# Patient Record
Sex: Female | Born: 1975 | Race: White | Hispanic: No | Smoking: Never smoker
Health system: Southern US, Community
[De-identification: ages and names within clinical notes are randomized; demographics above are authoritative.]

## PROBLEM LIST (undated history)

## (undated) DIAGNOSIS — R51 Headache: Secondary | ICD-10-CM

## (undated) DIAGNOSIS — R519 Headache, unspecified: Secondary | ICD-10-CM

## (undated) DIAGNOSIS — E041 Nontoxic single thyroid nodule: Secondary | ICD-10-CM

## (undated) DIAGNOSIS — I10 Essential (primary) hypertension: Secondary | ICD-10-CM

## (undated) HISTORY — DX: Headache, unspecified: R51.9

## (undated) HISTORY — DX: Essential (primary) hypertension: I10

## (undated) HISTORY — PX: FOOT SURGERY: SHX648

## (undated) HISTORY — DX: Headache: R51

---

## 2005-09-16 ENCOUNTER — Observation Stay: Payer: Self-pay | Admitting: Obstetrics and Gynecology

## 2005-11-11 ENCOUNTER — Observation Stay: Payer: Self-pay | Admitting: Obstetrics and Gynecology

## 2005-11-17 ENCOUNTER — Observation Stay: Payer: Self-pay | Admitting: Certified Nurse Midwife

## 2005-11-24 ENCOUNTER — Inpatient Hospital Stay: Payer: Self-pay

## 2008-05-12 HISTORY — PX: NOVASURE ABLATION: SHX5394

## 2009-05-12 HISTORY — PX: WRIST SURGERY: SHX841

## 2009-07-21 ENCOUNTER — Emergency Department: Payer: Self-pay | Admitting: Unknown Physician Specialty

## 2009-07-21 IMAGING — CR LEFT WRIST - COMPLETE 3+ VIEW
1 series · 3 of 3 positions shown · non-contrast
Comparison: none

REASON FOR EXAM: fall..pain at wrist
COMMENTS:   LMP: 2 weeks

[Series 1: view not recorded · 0.17mm/px · 3 of 3 slices shown]
[im 1/3]
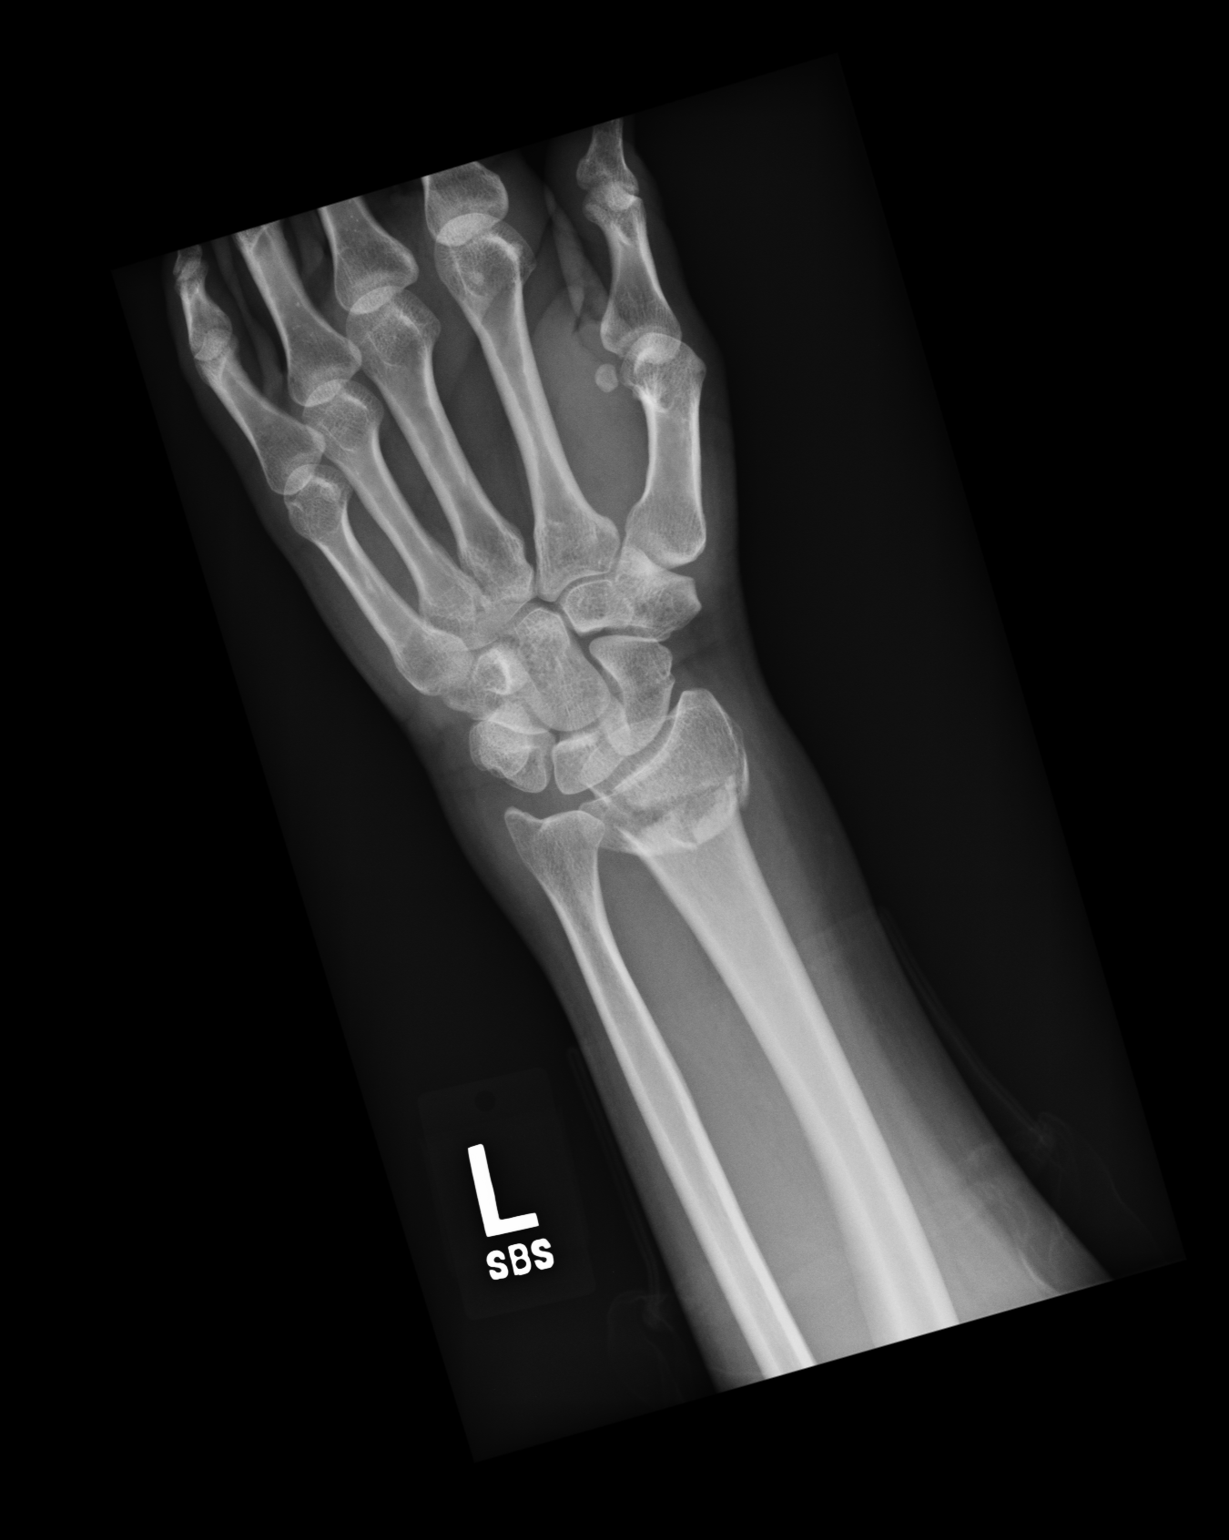
[im 2/3]
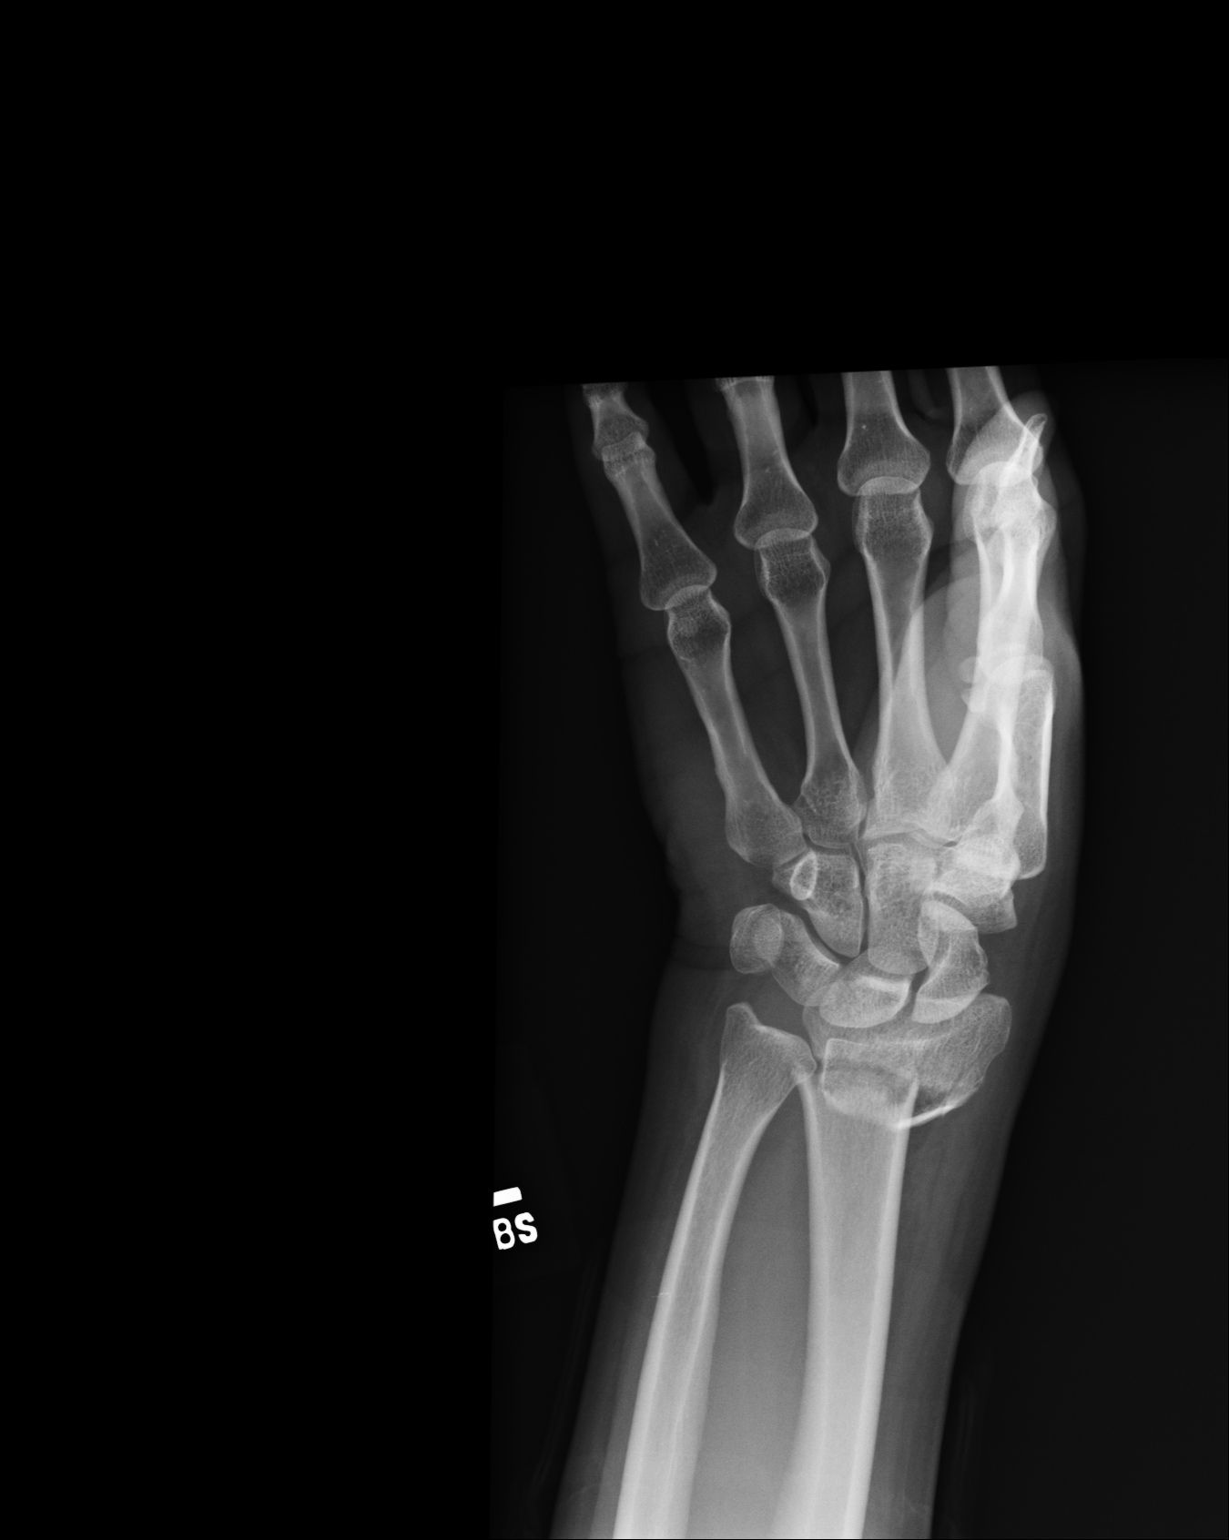
[im 3/3]
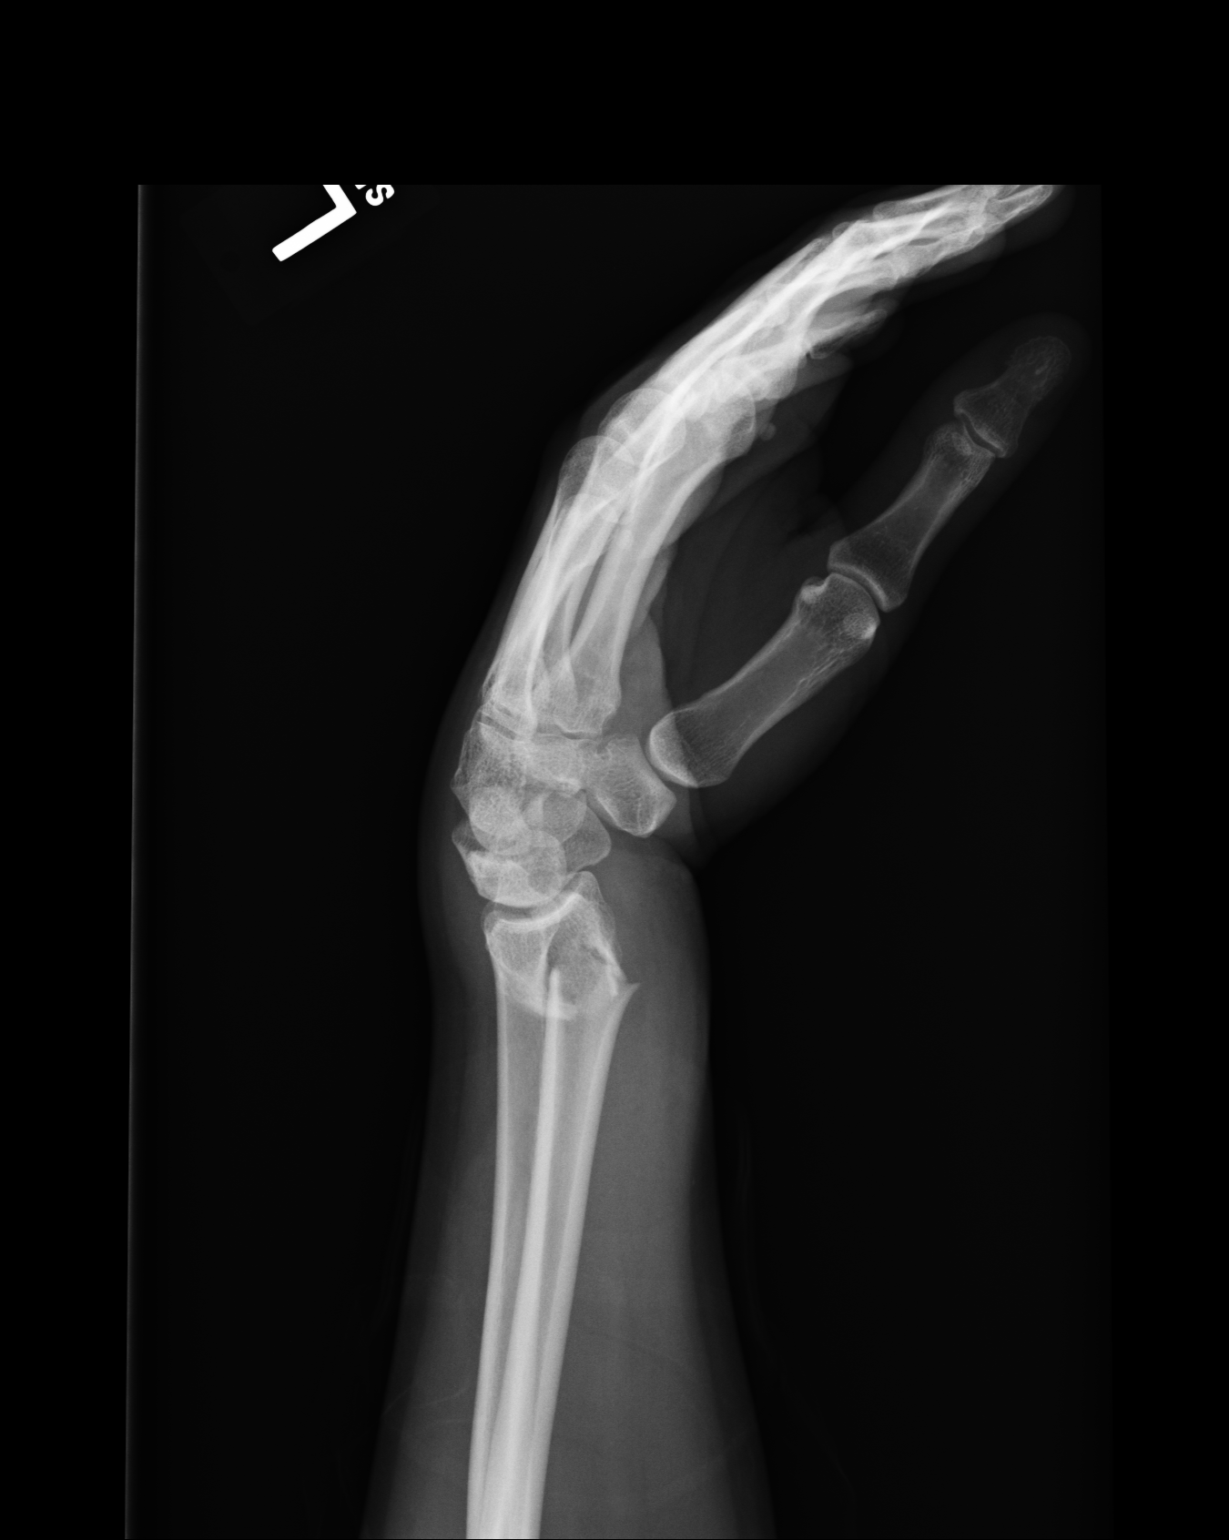

[3 of 3 positions shown; findings below may reference images not displayed]

PROCEDURE:     DXR - DXR WRIST LT COMP WITH OBLIQUES  - [DATE]  [DATE]

RESULT:

A comminuted impacted distal radius fracture is appreciated. The distal
fracture fragment demonstrates dorsal angulation and displacement. There is
intra-articular extension of the fracture into the radiocarpal joint as well
as within the distal radioulnar joint. The impacted portion of the fracture
demonstrates approximately 1.5 to 2 cm of override.
IMPRESSION: Distal radius fracture as described above.

## 2009-07-22 ENCOUNTER — Ambulatory Visit: Payer: Self-pay | Admitting: Unknown Physician Specialty

## 2010-12-16 ENCOUNTER — Ambulatory Visit: Payer: Self-pay | Admitting: Internal Medicine

## 2010-12-16 ENCOUNTER — Other Ambulatory Visit: Payer: Self-pay | Admitting: Internal Medicine

## 2011-10-09 ENCOUNTER — Encounter: Payer: Self-pay | Admitting: Internal Medicine

## 2011-10-09 ENCOUNTER — Ambulatory Visit (INDEPENDENT_AMBULATORY_CARE_PROVIDER_SITE_OTHER): Payer: BC Managed Care – PPO | Admitting: Internal Medicine

## 2011-10-09 VITALS — BP 128/86 | HR 66 | Temp 98.7°F | Resp 14 | Ht 67.0 in | Wt 164.8 lb

## 2011-10-09 DIAGNOSIS — I10 Essential (primary) hypertension: Secondary | ICD-10-CM

## 2011-10-09 DIAGNOSIS — Z309 Encounter for contraceptive management, unspecified: Secondary | ICD-10-CM

## 2011-10-09 DIAGNOSIS — K649 Unspecified hemorrhoids: Secondary | ICD-10-CM

## 2011-10-09 DIAGNOSIS — IMO0001 Reserved for inherently not codable concepts without codable children: Secondary | ICD-10-CM | POA: Insufficient documentation

## 2011-10-09 MED ORDER — HYDROCORTISONE 2.5 % RE CREA: TOPICAL_CREAM | Freq: Two times a day (BID) | RECTAL | Status: DC

## 2011-10-09 MED ORDER — NORETHINDRONE 0.35 MG PO TABS: 1.0000 | ORAL_TABLET | Freq: Every day | ORAL | Status: DC

## 2011-10-09 MED ORDER — ATENOLOL 50 MG PO TABS: 50.0000 mg | ORAL_TABLET | Freq: Every day | ORAL | Status: DC

## 2011-10-09 MED ORDER — TRIAMTERENE-HCTZ 37.5-25 MG PO TABS: 1.0000 | ORAL_TABLET | Freq: Every day | ORAL | Status: DC

## 2011-10-09 NOTE — Progress Notes (Signed)
Subjective:    Patient ID: Jennifer Olsen, female    DOB: [birth year], 36 y.o.   MRN: [id number]  HPI 36 year old female with history of hypertension presents to establish care. She reports that she has had elevated blood pressure since she was 18. This has been evaluated in the past with renal artery Doppler which was normal. She reports that nearly everyone in her family has elevated blood pressure. In the past, her blood pressure was well controlled with atenolol and diuretic. She is currently taking atenolol alone, and reports that her blood pressures typically elevated greater than 140/90 when she checks it. She has also had some palpitations. She denies any chest pain or shortness of breath. She is interested in resuming diuretic medication.  She also has a history of hemorrhoids. These have occurred intermittently since the birth of her children. They are controlled with topical hydrocortisone cream. They are not currently present or bleeding.  She notes some increased stress recently with separation from her husband and recent move into a new home. Otherwise, she reports that she is feeling well.  Outpatient Encounter Prescriptions as of 10/09/2011  Medication Sig Dispense Refill  . atenolol (TENORMIN) 50 MG tablet Take 1 tablet (50 mg total) by mouth daily.  30 tablet  11  . Ferrous Sulfate 27 MG TABS Take by mouth daily.      . hydrocortisone (PROCTOSOL HC) 2.5 % rectal cream Place rectally 2 (two) times daily.  30 g  11  . loratadine (CLARITIN) 10 MG tablet Take 10 mg by mouth daily.      . Multiple Vitamin (MULTIVITAMIN) tablet Take 1 tablet by mouth daily.      . norethindrone (MICRONOR,CAMILA,ERRIN) 0.35 MG tablet Take 1 tablet (0.35 mg total) by mouth daily.  1 Package  11  . triamterene-hydrochlorothiazide (MAXZIDE-25) 37.5-25 MG per tablet Take 1 each (1 tablet total) by mouth daily.  30 tablet  11   Review of Systems  Constitutional: Negative for fever, chills, appetite  change, fatigue and unexpected weight change.  HENT: Negative for ear pain, congestion, sore throat, trouble swallowing, neck pain, voice change and sinus pressure.   Eyes: Negative for visual disturbance.  Respiratory: Negative for cough, shortness of breath, wheezing and stridor.   Cardiovascular: Positive for palpitations. Negative for chest pain and leg swelling.  Gastrointestinal: Negative for nausea, vomiting, abdominal pain, diarrhea, constipation, blood in stool, abdominal distention and anal bleeding.  Genitourinary: Negative for dysuria and flank pain.  Musculoskeletal: Negative for myalgias, arthralgias and gait problem.  Skin: Negative for color change and rash.  Neurological: Negative for dizziness and headaches.  Hematological: Negative for adenopathy. Does not bruise/bleed easily.  Psychiatric/Behavioral: Negative for suicidal ideas, sleep disturbance and dysphoric mood. The patient is nervous/anxious.    BP 128/86  Pulse 66  Temp(Src) 98.7 F (37.1 C) (Oral)  Resp 14  Ht 5\' 7"  (1.702 m)  Wt 164 lb 13 oz (74.758 kg)  BMI 25.81 kg/m2  SpO2 98%  LMP 10/09/2011     Objective:   Physical Exam  Constitutional: She is oriented to person, place, and time. She appears well-developed and well-nourished. No distress.  HENT:  Head: Normocephalic and atraumatic.  Right Ear: External ear normal.  Left Ear: External ear normal.  Nose: Nose normal.  Mouth/Throat: Oropharynx is clear and moist. No oropharyngeal exudate.  Eyes: Conjunctivae are normal. Pupils are equal, round, and reactive to light. Right eye exhibits no discharge. Left eye exhibits no discharge. No scleral icterus.  Neck: Normal range of motion. Neck supple. No tracheal deviation present. No thyromegaly present.  Cardiovascular: Normal rate, regular rhythm, normal heart sounds and intact distal pulses.  Exam reveals no gallop and no friction rub.   No murmur heard. Pulmonary/Chest: Effort normal and breath sounds  normal. No respiratory distress. She has no wheezes. She has no rales. She exhibits no tenderness.  Abdominal: Soft. Bowel sounds are normal. She exhibits no distension and no mass. There is no tenderness. There is no rebound and no guarding.  Musculoskeletal: Normal range of motion. She exhibits no edema and no tenderness.  Lymphadenopathy:    She has no cervical adenopathy.  Neurological: She is alert and oriented to person, place, and time. No cranial nerve deficit. She exhibits normal muscle tone. Coordination normal.  Skin: Skin is warm and dry. No rash noted. She is not diaphoretic. No erythema. No pallor.  Psychiatric: She has a normal mood and affect. Her behavior is normal. Judgment and thought content normal.          Assessment & Plan:

## 2011-10-09 NOTE — Assessment & Plan Note (Signed)
Well-controlled today, however blood pressure elevated recently per patient. Will continue atenolol and will resume diuretic as this worked well for patient the past. We'll plan to check urine microalbumin and renal function with labs next week. Patient will followup in one month.

## 2011-10-09 NOTE — Assessment & Plan Note (Signed)
Tolerating oral contraceptives well. Will continue. Will get records on last Pap smear.

## 2011-10-09 NOTE — Assessment & Plan Note (Signed)
Currently asymptomatic. Will refill hydrocortisone cream for her to use as needed.

## 2012-10-01 ENCOUNTER — Other Ambulatory Visit: Payer: Self-pay | Admitting: Internal Medicine

## 2012-10-14 ENCOUNTER — Other Ambulatory Visit (INDEPENDENT_AMBULATORY_CARE_PROVIDER_SITE_OTHER): Payer: Managed Care, Other (non HMO)

## 2012-10-14 ENCOUNTER — Other Ambulatory Visit: Payer: BC Managed Care – PPO

## 2012-10-14 DIAGNOSIS — I10 Essential (primary) hypertension: Secondary | ICD-10-CM

## 2012-10-14 LAB — COMPREHENSIVE METABOLIC PANEL
AST: 20 U/L (ref 0–37)
BUN: 14 mg/dL (ref 6–23)
Calcium: 9.5 mg/dL (ref 8.4–10.5)
Chloride: 106 mEq/L (ref 96–112)
Creatinine, Ser: 0.7 mg/dL (ref 0.4–1.2)
GFR: 109.14 mL/min (ref >60.00)

## 2012-10-14 LAB — CBC WITH DIFFERENTIAL/PLATELET
Basophils Absolute: 0 10*3/uL (ref 0.0–0.1)
Eosinophils Absolute: 0.1 10*3/uL (ref 0.0–0.7)
HCT: 44.5 % (ref 36.0–46.0)
Lymphs Abs: 1.7 10*3/uL (ref 0.7–4.0)
MCHC: 33.3 g/dL (ref 30.0–36.0)
MCV: 92.4 fl (ref 78.0–100.0)
Monocytes Absolute: 0.6 10*3/uL (ref 0.1–1.0)
Platelets: 189 10*3/uL (ref 150.0–400.0)
RDW: 13.2 % (ref 11.5–14.6)

## 2012-10-14 LAB — LIPID PANEL
HDL: 42.8 mg/dL (ref >39.00)
Total CHOL/HDL Ratio: 3
Triglycerides: 63 mg/dL (ref 0.0–149.0)

## 2012-10-15 ENCOUNTER — Encounter: Payer: Self-pay | Admitting: *Deleted

## 2012-10-18 ENCOUNTER — Encounter: Payer: BC Managed Care – PPO | Admitting: Internal Medicine

## 2012-10-20 ENCOUNTER — Encounter: Payer: Self-pay | Admitting: Internal Medicine

## 2012-10-20 ENCOUNTER — Ambulatory Visit (INDEPENDENT_AMBULATORY_CARE_PROVIDER_SITE_OTHER): Payer: Managed Care, Other (non HMO) | Admitting: Internal Medicine

## 2012-10-20 ENCOUNTER — Other Ambulatory Visit (HOSPITAL_COMMUNITY)
Admission: RE | Admit: 2012-10-20 | Discharge: 2012-10-20 | Disposition: A | Discharge status: Home / Self Care | Payer: Managed Care, Other (non HMO) | Source: Ambulatory Visit | Attending: Internal Medicine | Admitting: Internal Medicine

## 2012-10-20 VITALS — BP 124/78 | HR 74 | Temp 98.5°F | Ht 67.25 in | Wt 160.0 lb

## 2012-10-20 DIAGNOSIS — K649 Unspecified hemorrhoids: Secondary | ICD-10-CM

## 2012-10-20 DIAGNOSIS — Z309 Encounter for contraceptive management, unspecified: Secondary | ICD-10-CM

## 2012-10-20 DIAGNOSIS — IMO0001 Reserved for inherently not codable concepts without codable children: Secondary | ICD-10-CM

## 2012-10-20 DIAGNOSIS — Z01419 Encounter for gynecological examination (general) (routine) without abnormal findings: Secondary | ICD-10-CM | POA: Insufficient documentation

## 2012-10-20 DIAGNOSIS — I10 Essential (primary) hypertension: Secondary | ICD-10-CM

## 2012-10-20 DIAGNOSIS — Z0000 Encounter for general adult medical examination without abnormal findings: Secondary | ICD-10-CM

## 2012-10-20 DIAGNOSIS — Z1151 Encounter for screening for human papillomavirus (HPV): Secondary | ICD-10-CM | POA: Insufficient documentation

## 2012-10-20 LAB — MICROALBUMIN / CREATININE URINE RATIO: Microalb Creat Ratio: 1 mg/g (ref 0.0–30.0)

## 2012-10-20 MED ORDER — TRIAMTERENE-HCTZ 37.5-25 MG PO TABS: ORAL_TABLET | ORAL | Status: DC

## 2012-10-20 MED ORDER — NORETHINDRONE 0.35 MG PO TABS: 1.0000 | ORAL_TABLET | Freq: Every day | ORAL | Status: DC

## 2012-10-20 MED ORDER — HYDROCORTISONE 2.5 % RE CREA: TOPICAL_CREAM | Freq: Two times a day (BID) | RECTAL | Status: DC

## 2012-10-20 MED ORDER — ATENOLOL 50 MG PO TABS: ORAL_TABLET | ORAL | Status: DC

## 2012-10-20 NOTE — Assessment & Plan Note (Signed)
BP Readings from Last 3 Encounters:  10/20/12 124/78  10/09/11 128/86   BP well controlled on current medication. Will continue.

## 2012-10-20 NOTE — Progress Notes (Signed)
Subjective:    Patient ID: Jennifer Olsen, female    DOB: Jun 06, 1975, 37 y.o.   MRN: [id number]  HPI 37YO female with h/o HTN presents for annual exam. Doing well. No concerns today. Compliant with medications. No headache, palpitations, chest pain.  Last PAP 2012 normal.  Outpatient Encounter Prescriptions as of 10/20/2012  Medication Sig Dispense Refill  . atenolol (TENORMIN) 50 MG tablet TAKE 1 TABLET (50 MG TOTAL) BY MOUTH DAILY.  90 tablet  4  . hydrocortisone (PROCTOSOL HC) 2.5 % rectal cream Place rectally 2 (two) times daily.  30 g  11  . Multiple Vitamin (MULTIVITAMIN) tablet Take 1 tablet by mouth daily.      . norethindrone (MICRONOR,CAMILA,ERRIN) 0.35 MG tablet Take 1 tablet (0.35 mg total) by mouth daily.  3 Package  4  . triamterene-hydrochlorothiazide (MAXZIDE-25) 37.5-25 MG per tablet TAKE 1 EACH (1 TABLET TOTAL) BY MOUTH DAILY.  90 tablet  4  . Ferrous Sulfate 27 MG TABS Take by mouth daily.      Marland Kitchen loratadine (CLARITIN) 10 MG tablet Take 10 mg by mouth daily.       No facility-administered encounter medications on file as of 10/20/2012.   BP 124/78  Pulse 74  Temp(Src) 98.5 F (36.9 C) (Oral)  Ht 5' 7.25" (1.708 m)  Wt 160 lb (72.576 kg)  BMI 24.88 kg/m2  SpO2 96%  LMP 08/20/2012  Review of Systems  Constitutional: Negative for fever, chills, appetite change, fatigue and unexpected weight change.  HENT: Negative for ear pain, congestion, sore throat, trouble swallowing, neck pain, voice change and sinus pressure.   Eyes: Negative for visual disturbance.  Respiratory: Negative for cough, shortness of breath, wheezing and stridor.   Cardiovascular: Negative for chest pain, palpitations and leg swelling.  Gastrointestinal: Negative for nausea, vomiting, abdominal pain, diarrhea, constipation, blood in stool, abdominal distention and anal bleeding.  Genitourinary: Negative for dysuria and flank pain.  Musculoskeletal: Negative for myalgias, arthralgias and gait  problem.  Skin: Negative for color change and rash.  Neurological: Negative for dizziness and headaches.  Hematological: Negative for adenopathy. Does not bruise/bleed easily.  Psychiatric/Behavioral: Negative for suicidal ideas, sleep disturbance and dysphoric mood. The patient is not nervous/anxious.        Objective:   Physical Exam  Constitutional: She is oriented to person, place, and time. She appears well-developed and well-nourished. No distress.  HENT:  Head: Normocephalic and atraumatic.  Right Ear: External ear normal.  Left Ear: External ear normal.  Nose: Nose normal.  Mouth/Throat: Oropharynx is clear and moist. No oropharyngeal exudate.  Eyes: Conjunctivae are normal. Pupils are equal, round, and reactive to light. Right eye exhibits no discharge. Left eye exhibits no discharge. No scleral icterus.  Neck: Normal range of motion. Neck supple. No tracheal deviation present. No thyromegaly present.  Cardiovascular: Normal rate, regular rhythm, normal heart sounds and intact distal pulses.  Exam reveals no gallop and no friction rub.   No murmur heard. Pulmonary/Chest: Effort normal and breath sounds normal. No accessory muscle usage. Not tachypneic. No respiratory distress. She has no decreased breath sounds. She has no wheezes. She has no rhonchi. She has no rales. She exhibits no tenderness.  Abdominal: Soft. Bowel sounds are normal. She exhibits no distension and no mass. There is no tenderness. There is no rebound and no guarding.  Genitourinary: Rectum normal, vagina normal and uterus normal. No breast swelling, tenderness, discharge or bleeding. Pelvic exam was performed with patient supine. There is no rash,  tenderness or lesion on the right labia. There is no rash, tenderness or lesion on the left labia. Uterus is not enlarged and not tender. Cervix exhibits no motion tenderness, no discharge and no friability. Right adnexum displays no mass, no tenderness and no fullness.  Left adnexum displays no mass, no tenderness and no fullness. No erythema or tenderness around the vagina. No vaginal discharge found.  Musculoskeletal: Normal range of motion. She exhibits no edema and no tenderness.  Lymphadenopathy:    She has no cervical adenopathy.  Neurological: She is alert and oriented to person, place, and time. No cranial nerve deficit. She exhibits normal muscle tone. Coordination normal.  Skin: Skin is warm and dry. No rash noted. She is not diaphoretic. No erythema. No pallor.  Psychiatric: She has a normal mood and affect. Her behavior is normal. Judgment and thought content normal.          Assessment & Plan:

## 2012-10-20 NOTE — Assessment & Plan Note (Signed)
General medical exam normal today including breast and pelvic exam. PAP is pending. Reviewed recent labs which were normal. Immunizations are UTD. Encouraged continued healthy lifestyle. Follow up 6 months and prn.

## 2012-10-21 ENCOUNTER — Encounter: Payer: Self-pay | Admitting: *Deleted

## 2012-10-21 NOTE — Progress Notes (Signed)
Quick Note:  Normal letter mailed to patient home address on file. ______ 

## 2012-10-25 ENCOUNTER — Encounter: Payer: Self-pay | Admitting: *Deleted

## 2012-10-25 ENCOUNTER — Other Ambulatory Visit: Payer: Self-pay | Admitting: Internal Medicine

## 2012-10-25 NOTE — Telephone Encounter (Signed)
Please Advise

## 2012-10-28 ENCOUNTER — Other Ambulatory Visit: Payer: Self-pay | Admitting: Internal Medicine

## 2012-10-29 ENCOUNTER — Telehealth: Payer: Self-pay | Admitting: Internal Medicine

## 2012-10-29 NOTE — Telephone Encounter (Signed)
Patient needing all her medication's refilled

## 2012-10-29 NOTE — Telephone Encounter (Signed)
Spoke with patient she stated she called the pharmacy and was told her prescriptions was not there. Explained to the patient we had sent the prescription to the pharmacy and had confirmation of receipt. Patient was not very happy with this, I then called [facility] to check on this. Spoke with pharmacist Leonette Most and he stated the prescriptions were there and ready to picked up. The patient aware she has multiple profiles there and they can not merge them so when she calls she needs to give them her date of birth and be more specific with the name she is giving them. Informed the patient of this and she was still not very happy with this, while I was still on the phone I heard her talking to the pharmacist then she disconnected the call.

## 2013-05-02 ENCOUNTER — Ambulatory Visit: Payer: Managed Care, Other (non HMO) | Admitting: Internal Medicine

## 2013-10-20 ENCOUNTER — Encounter: Payer: Self-pay | Admitting: Internal Medicine

## 2013-10-20 LAB — HM PAP SMEAR: HM Pap smear: NEGATIVE

## 2013-11-02 ENCOUNTER — Encounter: Payer: Self-pay | Admitting: Internal Medicine

## 2013-11-15 ENCOUNTER — Other Ambulatory Visit: Payer: Self-pay | Admitting: Internal Medicine

## 2013-12-19 ENCOUNTER — Encounter: Payer: Self-pay | Admitting: Internal Medicine

## 2013-12-27 ENCOUNTER — Ambulatory Visit (INDEPENDENT_AMBULATORY_CARE_PROVIDER_SITE_OTHER): Payer: Managed Care, Other (non HMO) | Admitting: Internal Medicine

## 2013-12-27 ENCOUNTER — Encounter: Payer: Self-pay | Admitting: Internal Medicine

## 2013-12-27 VITALS — BP 118/82 | HR 67 | Temp 98.3°F | Ht 66.75 in | Wt 158.5 lb

## 2013-12-27 DIAGNOSIS — K649 Unspecified hemorrhoids: Secondary | ICD-10-CM

## 2013-12-27 DIAGNOSIS — Z0000 Encounter for general adult medical examination without abnormal findings: Secondary | ICD-10-CM | POA: Diagnosis not present

## 2013-12-27 LAB — CBC WITH DIFFERENTIAL/PLATELET
BASOS PCT: 0.6 % (ref 0.0–3.0)
Basophils Absolute: 0 10*3/uL (ref 0.0–0.1)
Eosinophils Absolute: 0.1 10*3/uL (ref 0.0–0.7)
Eosinophils Relative: 1.8 % (ref 0.0–5.0)
HCT: 43.5 % (ref 36.0–46.0)
Hemoglobin: 14.4 g/dL (ref 12.0–15.0)
LYMPHS PCT: 35.2 % (ref 12.0–46.0)
Lymphs Abs: 2 10*3/uL (ref 0.7–4.0)
MCHC: 33.2 g/dL (ref 30.0–36.0)
MCV: 91.3 fl (ref 78.0–100.0)
MONOS PCT: 12 % (ref 3.0–12.0)
Monocytes Absolute: 0.7 10*3/uL (ref 0.1–1.0)
Neutro Abs: 2.8 10*3/uL (ref 1.4–7.7)
Neutrophils Relative %: 50.4 % (ref 43.0–77.0)
Platelets: 191 10*3/uL (ref 150.0–400.0)
RBC: 4.77 Mil/uL (ref 3.87–5.11)
RDW: 13.4 % (ref 11.5–15.5)
WBC: 5.6 10*3/uL (ref 4.0–10.5)

## 2013-12-27 LAB — LIPID PANEL
CHOL/HDL RATIO: 3
Cholesterol: 152 mg/dL (ref 0–200)
HDL: 47.1 mg/dL (ref >39.00)
LDL Cholesterol: 86 mg/dL (ref 0–99)
NONHDL: 104.9
TRIGLYCERIDES: 94 mg/dL (ref 0.0–149.0)
VLDL: 18.8 mg/dL (ref 0.0–40.0)

## 2013-12-27 LAB — MICROALBUMIN / CREATININE URINE RATIO
CREATININE, U: 94.4 mg/dL
MICROALB UR: 2.4 mg/dL — AB (ref 0.0–1.9)
MICROALB/CREAT RATIO: 2.5 mg/g (ref 0.0–30.0)

## 2013-12-27 LAB — POCT URINALYSIS DIPSTICK
Bilirubin, UA: NEGATIVE
Glucose, UA: NEGATIVE
Ketones, UA: NEGATIVE
Leukocytes, UA: NEGATIVE
Nitrite, UA: NEGATIVE
PROTEIN UA: NEGATIVE
RBC UA: NEGATIVE
SPEC GRAV UA: 1.015
UROBILINOGEN UA: 0.2
pH, UA: 6.5

## 2013-12-27 LAB — COMPREHENSIVE METABOLIC PANEL
ALBUMIN: 4.5 g/dL (ref 3.5–5.2)
ALT: 12 U/L (ref 0–35)
AST: 18 U/L (ref 0–37)
Alkaline Phosphatase: 40 U/L (ref 39–117)
BUN: 16 mg/dL (ref 6–23)
CALCIUM: 10.2 mg/dL (ref 8.4–10.5)
CHLORIDE: 102 meq/L (ref 96–112)
CO2: 29 mEq/L (ref 19–32)
Creatinine, Ser: 0.7 mg/dL (ref 0.4–1.2)
GFR: 108.43 mL/min (ref >60.00)
Glucose, Bld: 83 mg/dL (ref 70–99)
POTASSIUM: 4.2 meq/L (ref 3.5–5.1)
Sodium: 139 mEq/L (ref 135–145)
Total Bilirubin: 0.9 mg/dL (ref 0.2–1.2)
Total Protein: 7.2 g/dL (ref 6.0–8.3)

## 2013-12-27 LAB — VITAMIN D 25 HYDROXY (VIT D DEFICIENCY, FRACTURES): VITD: 37.86 ng/mL (ref 30.00–100.00)

## 2013-12-27 MED ORDER — HYDROCORTISONE 2.5 % RE CREA: TOPICAL_CREAM | Freq: Two times a day (BID) | RECTAL | Status: DC

## 2013-12-27 MED ORDER — NORETHINDRONE 0.35 MG PO TABS: 1.0000 | ORAL_TABLET | Freq: Every day | ORAL | Status: DC

## 2013-12-27 MED ORDER — TRIAMTERENE-HCTZ 37.5-25 MG PO TABS: 1.0000 | ORAL_TABLET | Freq: Every day | ORAL | Status: DC

## 2013-12-27 MED ORDER — ATENOLOL 50 MG PO TABS: 50.0000 mg | ORAL_TABLET | Freq: Every day | ORAL | Status: DC

## 2013-12-27 NOTE — Assessment & Plan Note (Signed)
General medical exam including breast exam normal today. PAP and pelvic deferred as PAP normal in 2014. Plan repeat PAP in 2017. Labs today including CBC, CMP, lipids. Plan for flu vaccine this fall. Encouraged healthy diet and exercise.

## 2013-12-27 NOTE — Progress Notes (Signed)
Pre visit review using our clinic review tool, if applicable. No additional management support is needed unless otherwise documented below in the visit note. 

## 2013-12-27 NOTE — Patient Instructions (Signed)

## 2013-12-27 NOTE — Progress Notes (Signed)
Subjective:    Patient ID: Jennifer Olsen, female    DOB: 24-Feb-1976, 38 y.o.   MRN: [id number]  HPI 38YO female presents for annual exam.  Feeling well. Recently married. No concerns today. Tries to follow healthy diet and stay physically active.   Review of Systems  Constitutional: Negative for fever, chills, appetite change, fatigue and unexpected weight change.  Eyes: Negative for visual disturbance.  Respiratory: Negative for shortness of breath.   Cardiovascular: Negative for chest pain and leg swelling.  Gastrointestinal: Negative for nausea, vomiting, abdominal pain, diarrhea and constipation.  Genitourinary: Negative for menstrual problem.  Musculoskeletal: Negative for arthralgias and myalgias.  Skin: Negative for color change and rash.  Neurological: Negative for weakness and headaches.  Hematological: Negative for adenopathy. Does not bruise/bleed easily.  Psychiatric/Behavioral: Negative for dysphoric mood. The patient is not nervous/anxious.        Objective:    BP 118/82  Pulse 67  Temp(Src) 98.3 F (36.8 C) (Oral)  Ht 5' 6.75" (1.695 m)  Wt 158 lb 8 oz (71.895 kg)  BMI 25.02 kg/m2  SpO2 99%  LMP 12/16/2013 Physical Exam  Constitutional: She is oriented to person, place, and time. She appears well-developed and well-nourished. No distress.  HENT:  Head: Normocephalic and atraumatic.  Right Ear: External ear normal.  Left Ear: External ear normal.  Nose: Nose normal.  Mouth/Throat: Oropharynx is clear and moist. No oropharyngeal exudate.  Eyes: Conjunctivae are normal. Pupils are equal, round, and reactive to light. Right eye exhibits no discharge. Left eye exhibits no discharge. No scleral icterus.  Neck: Normal range of motion. Neck supple. No tracheal deviation present. No thyromegaly present.  Cardiovascular: Normal rate, regular rhythm, normal heart sounds and intact distal pulses.  Exam reveals no gallop and no friction rub.   No murmur  heard. Pulmonary/Chest: Effort normal and breath sounds normal. No accessory muscle usage. Not tachypneic. No respiratory distress. She has no decreased breath sounds. She has no wheezes. She has no rales. She exhibits no tenderness. Right breast exhibits no inverted nipple, no mass, no nipple discharge, no skin change and no tenderness. Left breast exhibits no inverted nipple, no mass, no nipple discharge, no skin change and no tenderness. Breasts are symmetrical.  Abdominal: Soft. Bowel sounds are normal. She exhibits no distension and no mass. There is no tenderness. There is no rebound and no guarding.  Musculoskeletal: Normal range of motion. She exhibits no edema and no tenderness.  Lymphadenopathy:    She has no cervical adenopathy.  Neurological: She is alert and oriented to person, place, and time. No cranial nerve deficit. She exhibits normal muscle tone. Coordination normal.  Skin: Skin is warm and dry. No rash noted. She is not diaphoretic. No erythema. No pallor.  Psychiatric: She has a normal mood and affect. Her behavior is normal. Judgment and thought content normal.          Assessment & Plan:   Problem List Items Addressed This Visit     Unprioritized   Hemorrhoids   Relevant Medications      triamterene-hydrochlorothiazide (MAXZIDE-25) 37.5-25 MG per tablet      atenolol (TENORMIN) tablet      hydrocortisone (PROCTOSOL HC) 2.5 % rectal cream   Other Relevant Orders      POCT urinalysis dipstick (Completed)   Routine general medical examination at a health care facility - Primary     General medical exam including breast exam normal today. PAP and pelvic deferred as PAP  normal in 2014. Plan repeat PAP in 2017. Labs today including CBC, CMP, lipids. Plan for flu vaccine this fall. Encouraged healthy diet and exercise.    Relevant Orders      CBC with Differential      Comprehensive metabolic panel      Lipid panel      Vit D  25 hydroxy (rtn osteoporosis monitoring)       Microalbumin / creatinine urine ratio      POCT urinalysis dipstick (Completed)       Return in about 1 year (around 12/28/2014) for Physical.

## 2013-12-28 ENCOUNTER — Encounter: Payer: Self-pay | Admitting: *Deleted

## 2014-12-18 ENCOUNTER — Other Ambulatory Visit: Payer: Self-pay | Admitting: Internal Medicine

## 2014-12-18 NOTE — Telephone Encounter (Signed)
Last OV 12/14/13 ok to fill?

## 2014-12-29 ENCOUNTER — Ambulatory Visit (INDEPENDENT_AMBULATORY_CARE_PROVIDER_SITE_OTHER): Payer: 59 | Admitting: Internal Medicine

## 2014-12-29 ENCOUNTER — Encounter: Payer: Self-pay | Admitting: Internal Medicine

## 2014-12-29 VITALS — BP 116/73 | HR 73 | Temp 98.3°F | Ht 67.25 in | Wt 153.0 lb

## 2014-12-29 DIAGNOSIS — Z0000 Encounter for general adult medical examination without abnormal findings: Secondary | ICD-10-CM

## 2014-12-29 DIAGNOSIS — K649 Unspecified hemorrhoids: Secondary | ICD-10-CM

## 2014-12-29 LAB — CBC WITH DIFFERENTIAL/PLATELET
BASOS PCT: 0.7 % (ref 0.0–3.0)
Basophils Absolute: 0 10*3/uL (ref 0.0–0.1)
EOS ABS: 0.2 10*3/uL (ref 0.0–0.7)
Eosinophils Relative: 3.7 % (ref 0.0–5.0)
HEMATOCRIT: 42 % (ref 36.0–46.0)
Hemoglobin: 14.2 g/dL (ref 12.0–15.0)
LYMPHS PCT: 26.8 % (ref 12.0–46.0)
Lymphs Abs: 1.5 10*3/uL (ref 0.7–4.0)
MCHC: 33.8 g/dL (ref 30.0–36.0)
MCV: 91.2 fl (ref 78.0–100.0)
MONO ABS: 0.6 10*3/uL (ref 0.1–1.0)
Monocytes Relative: 10.8 % (ref 3.0–12.0)
NEUTROS ABS: 3.3 10*3/uL (ref 1.4–7.7)
Neutrophils Relative %: 58 % (ref 43.0–77.0)
PLATELETS: 184 10*3/uL (ref 150.0–400.0)
RBC: 4.61 Mil/uL (ref 3.87–5.11)
RDW: 13.1 % (ref 11.5–15.5)
WBC: 5.6 10*3/uL (ref 4.0–10.5)

## 2014-12-29 LAB — LIPID PANEL
CHOL/HDL RATIO: 3
Cholesterol: 143 mg/dL (ref 0–200)
HDL: 48.1 mg/dL (ref >39.00)
LDL Cholesterol: 85 mg/dL (ref 0–99)
NONHDL: 94.74
Triglycerides: 47 mg/dL (ref 0.0–149.0)
VLDL: 9.4 mg/dL (ref 0.0–40.0)

## 2014-12-29 LAB — COMPREHENSIVE METABOLIC PANEL
ALT: 14 U/L (ref 0–35)
AST: 17 U/L (ref 0–37)
Albumin: 4.3 g/dL (ref 3.5–5.2)
Alkaline Phosphatase: 40 U/L (ref 39–117)
BILIRUBIN TOTAL: 1.1 mg/dL (ref 0.2–1.2)
BUN: 16 mg/dL (ref 6–23)
CO2: 28 meq/L (ref 19–32)
Calcium: 9.1 mg/dL (ref 8.4–10.5)
Chloride: 103 mEq/L (ref 96–112)
Creatinine, Ser: 0.67 mg/dL (ref 0.40–1.20)
GFR: 104.15 mL/min (ref >60.00)
GLUCOSE: 81 mg/dL (ref 70–99)
Potassium: 3.6 mEq/L (ref 3.5–5.1)
Sodium: 138 mEq/L (ref 135–145)
Total Protein: 6.3 g/dL (ref 6.0–8.3)

## 2014-12-29 LAB — MICROALBUMIN / CREATININE URINE RATIO
CREATININE, U: 265.2 mg/dL
MICROALB UR: 1.3 mg/dL (ref 0.0–1.9)
Microalb Creat Ratio: 0.5 mg/g (ref 0.0–30.0)

## 2014-12-29 LAB — VITAMIN D 25 HYDROXY (VIT D DEFICIENCY, FRACTURES): VITD: 43.61 ng/mL (ref 30.00–100.00)

## 2014-12-29 LAB — TSH: TSH: 0.96 u[IU]/mL (ref 0.35–4.50)

## 2014-12-29 MED ORDER — HYDROCORTISONE 2.5 % RE CREA: TOPICAL_CREAM | Freq: Two times a day (BID) | RECTAL | Status: DC

## 2014-12-29 MED ORDER — ATENOLOL 50 MG PO TABS: 50.0000 mg | ORAL_TABLET | Freq: Every day | ORAL | Status: DC

## 2014-12-29 MED ORDER — TRIAMTERENE-HCTZ 37.5-25 MG PO TABS: 1.0000 | ORAL_TABLET | Freq: Every day | ORAL | Status: DC

## 2014-12-29 MED ORDER — NORETHINDRONE 0.35 MG PO TABS: 1.0000 | ORAL_TABLET | Freq: Every day | ORAL | Status: DC

## 2014-12-29 NOTE — Patient Instructions (Signed)

## 2014-12-29 NOTE — Assessment & Plan Note (Signed)
General medical exam normal today. Pelvic exam deferred as PAP normal 2014, HPV neg. Discussed breast cancer screening. Encouraged healthy diet and exercise. Labs as ordered. Flu vaccine this fall.

## 2014-12-29 NOTE — Progress Notes (Signed)
Pre visit review using our clinic review tool, if applicable. No additional management support is needed unless otherwise documented below in the visit note. 

## 2014-12-29 NOTE — Progress Notes (Signed)
Subjective:    Patient ID: Jennifer Olsen, female    DOB: 1975/06/17, 39 y.o.   MRN: [id number]  HPI  39YO female presents for physical exam.  Feeling well. No concerns today.   Past Medical History  Diagnosis Date  . Persistent headaches   . Hypertension     s/p renal artery doppler which was normal   Family History  Problem Relation Age of Onset  . Aneurysm Mother   . Heart disease Mother   . Cancer Mother     breast or uterine   Past Surgical History  Procedure Laterality Date  . Foot surgery      bilateral, bunions, Dr. Elvina Mattes  . Wrist surgery  2011    left wrist, fracture, Dr. Mauri Pole  . Novasure ablation  2010    Dr. Amalia Hailey   Social History   Social History  . Marital Status: Single    Spouse Name: N/A  . Number of Children: N/A  . Years of Education: N/A   Social History Main Topics  . Smoking status: Never Smoker   . Smokeless tobacco: None  . Alcohol Use: No  . Drug Use: No  . Sexual Activity: Not Asked   Other Topics Concern  . None   Social History Narrative   Lives in [place] with 3 children 16, 12, 5YO.  Dog in home.   Recently married.   Works - [employer]   Diet - regular   Exercise - walking 20-51min daily    Review of Systems  Constitutional: Negative for fever, chills, appetite change, fatigue and unexpected weight change.  Eyes: Negative for visual disturbance.  Respiratory: Negative for shortness of breath.   Cardiovascular: Negative for chest pain and leg swelling.  Gastrointestinal: Negative for nausea, vomiting, abdominal pain, diarrhea and constipation.  Musculoskeletal: Negative for myalgias and arthralgias.  Skin: Negative for color change and rash.  Hematological: Negative for adenopathy. Does not bruise/bleed easily.  Psychiatric/Behavioral: Negative for suicidal ideas, sleep disturbance and dysphoric mood. The patient is not nervous/anxious.        Objective:    BP 116/73 mmHg  Pulse 73  Temp(Src) 98.3  F (36.8 C) (Oral)  Ht 5' 7.25" (1.708 m)  Wt 153 lb (69.4 kg)  BMI 23.79 kg/m2  SpO2 98% Physical Exam  Constitutional: She is oriented to person, place, and time. She appears well-developed and well-nourished. No distress.  HENT:  Head: Normocephalic and atraumatic.  Right Ear: External ear normal.  Left Ear: External ear normal.  Nose: Nose normal.  Mouth/Throat: Oropharynx is clear and moist. No oropharyngeal exudate.  Eyes: Conjunctivae and EOM are normal. Pupils are equal, round, and reactive to light. Right eye exhibits no discharge.  Neck: Normal range of motion. Neck supple. No thyromegaly present.  Cardiovascular: Normal rate, regular rhythm, normal heart sounds and intact distal pulses.  Exam reveals no gallop and no friction rub.   No murmur heard. Pulmonary/Chest: Effort normal. No respiratory distress. She has no wheezes. She has no rales.  Abdominal: Soft. Bowel sounds are normal. She exhibits no distension and no mass. There is no tenderness. There is no rebound and no guarding.  Musculoskeletal: Normal range of motion. She exhibits no edema or tenderness.  Lymphadenopathy:    She has no cervical adenopathy.  Neurological: She is alert and oriented to person, place, and time. No cranial nerve deficit. Coordination normal.  Skin: Skin is warm and dry. No rash noted. She is not diaphoretic. No erythema.  No pallor.  Psychiatric: She has a normal mood and affect. Her behavior is normal. Judgment and thought content normal.          Assessment & Plan:   Problem List Items Addressed This Visit      Unprioritized   Hemorrhoids   Relevant Medications   atenolol (TENORMIN) 50 MG tablet   hydrocortisone (PROCTOSOL HC) 2.5 % rectal cream   triamterene-hydrochlorothiazide (MAXZIDE-25) 37.5-25 MG per tablet   Routine general medical examination at a health care facility - Primary    General medical exam normal today. Pelvic exam deferred as PAP normal 2014, HPV neg.  Discussed breast cancer screening. Encouraged healthy diet and exercise. Labs as ordered. Flu vaccine this fall.      Relevant Orders   TSH   CBC with Differential/Platelet   Comprehensive metabolic panel   Lipid panel   Vit D  25 hydroxy (rtn osteoporosis monitoring)   Microalbumin / creatinine urine ratio       Return in about 1 year (around 12/29/2015) for Physical.

## 2015-05-13 HISTORY — PX: AUGMENTATION MAMMAPLASTY: SUR837

## 2015-09-25 DIAGNOSIS — M9904 Segmental and somatic dysfunction of sacral region: Secondary | ICD-10-CM | POA: Diagnosis not present

## 2015-09-25 DIAGNOSIS — M9901 Segmental and somatic dysfunction of cervical region: Secondary | ICD-10-CM | POA: Diagnosis not present

## 2015-09-25 DIAGNOSIS — M5413 Radiculopathy, cervicothoracic region: Secondary | ICD-10-CM | POA: Diagnosis not present

## 2015-09-25 DIAGNOSIS — M9903 Segmental and somatic dysfunction of lumbar region: Secondary | ICD-10-CM | POA: Diagnosis not present

## 2015-09-25 DIAGNOSIS — M461 Sacroiliitis, not elsewhere classified: Secondary | ICD-10-CM | POA: Diagnosis not present

## 2015-09-25 DIAGNOSIS — M545 Low back pain: Secondary | ICD-10-CM | POA: Diagnosis not present

## 2015-11-06 DIAGNOSIS — M479 Spondylosis, unspecified: Secondary | ICD-10-CM | POA: Diagnosis not present

## 2015-11-06 DIAGNOSIS — M47817 Spondylosis without myelopathy or radiculopathy, lumbosacral region: Secondary | ICD-10-CM | POA: Diagnosis not present

## 2015-12-31 ENCOUNTER — Other Ambulatory Visit: Payer: Self-pay

## 2015-12-31 ENCOUNTER — Encounter: Payer: 59 | Admitting: Family Medicine

## 2015-12-31 MED ORDER — ATENOLOL 50 MG PO TABS: 50.0000 mg | ORAL_TABLET | Freq: Every day | ORAL | 0 refills | Status: DC

## 2015-12-31 MED ORDER — TRIAMTERENE-HCTZ 37.5-25 MG PO TABS: 1.0000 | ORAL_TABLET | Freq: Every day | ORAL | 0 refills | Status: DC

## 2015-12-31 NOTE — Telephone Encounter (Signed)
Refill request for Tenormin & Maxide-25, last seen 19AUG2016, last filled 19AUG2016. By Dr. Gilford Rile.  Please advise.

## 2016-01-01 NOTE — Telephone Encounter (Signed)
It appears that this has already been sent in.  She needs to keep her establish care appt with me.

## 2016-01-18 ENCOUNTER — Ambulatory Visit (INDEPENDENT_AMBULATORY_CARE_PROVIDER_SITE_OTHER): Payer: 59 | Admitting: Internal Medicine

## 2016-01-18 ENCOUNTER — Encounter: Payer: Self-pay | Admitting: Internal Medicine

## 2016-01-18 ENCOUNTER — Other Ambulatory Visit (HOSPITAL_COMMUNITY)
Admission: RE | Admit: 2016-01-18 | Discharge: 2016-01-18 | Disposition: A | Discharge status: Home / Self Care | Payer: 59 | Source: Ambulatory Visit | Attending: Internal Medicine | Admitting: Internal Medicine

## 2016-01-18 VITALS — BP 116/70 | HR 68 | Temp 98.5°F | Ht 67.0 in | Wt 157.2 lb

## 2016-01-18 DIAGNOSIS — Z0000 Encounter for general adult medical examination without abnormal findings: Secondary | ICD-10-CM

## 2016-01-18 DIAGNOSIS — Z1151 Encounter for screening for human papillomavirus (HPV): Secondary | ICD-10-CM | POA: Insufficient documentation

## 2016-01-18 DIAGNOSIS — Z01419 Encounter for gynecological examination (general) (routine) without abnormal findings: Secondary | ICD-10-CM | POA: Diagnosis not present

## 2016-01-18 DIAGNOSIS — Z3041 Encounter for surveillance of contraceptive pills: Secondary | ICD-10-CM | POA: Diagnosis not present

## 2016-01-18 DIAGNOSIS — Z124 Encounter for screening for malignant neoplasm of cervix: Secondary | ICD-10-CM | POA: Diagnosis not present

## 2016-01-18 DIAGNOSIS — I10 Essential (primary) hypertension: Secondary | ICD-10-CM | POA: Diagnosis not present

## 2016-01-18 DIAGNOSIS — K649 Unspecified hemorrhoids: Secondary | ICD-10-CM

## 2016-01-18 LAB — CBC WITH DIFFERENTIAL/PLATELET
BASOS ABS: 0 10*3/uL (ref 0.0–0.1)
Basophils Relative: 0.5 % (ref 0.0–3.0)
EOS ABS: 0.1 10*3/uL (ref 0.0–0.7)
Eosinophils Relative: 1 % (ref 0.0–5.0)
HCT: 42.4 % (ref 36.0–46.0)
Hemoglobin: 14.8 g/dL (ref 12.0–15.0)
LYMPHS ABS: 1.5 10*3/uL (ref 0.7–4.0)
Lymphocytes Relative: 29.7 % (ref 12.0–46.0)
MCHC: 34.9 g/dL (ref 30.0–36.0)
MCV: 89.2 fl (ref 78.0–100.0)
MONO ABS: 0.5 10*3/uL (ref 0.1–1.0)
MONOS PCT: 10.6 % (ref 3.0–12.0)
NEUTROS PCT: 58.2 % (ref 43.0–77.0)
Neutro Abs: 3 10*3/uL (ref 1.4–7.7)
Platelets: 191 10*3/uL (ref 150.0–400.0)
RBC: 4.76 Mil/uL (ref 3.87–5.11)
RDW: 13 % (ref 11.5–15.5)
WBC: 5.2 10*3/uL (ref 4.0–10.5)

## 2016-01-18 LAB — COMPREHENSIVE METABOLIC PANEL
ALBUMIN: 4.4 g/dL (ref 3.5–5.2)
ALK PHOS: 45 U/L (ref 39–117)
ALT: 11 U/L (ref 0–35)
AST: 14 U/L (ref 0–37)
BILIRUBIN TOTAL: 0.7 mg/dL (ref 0.2–1.2)
BUN: 17 mg/dL (ref 6–23)
CALCIUM: 8.9 mg/dL (ref 8.4–10.5)
CO2: 30 meq/L (ref 19–32)
CREATININE: 0.78 mg/dL (ref 0.40–1.20)
Chloride: 105 mEq/L (ref 96–112)
GFR: 86.92 mL/min (ref >60.00)
Glucose, Bld: 94 mg/dL (ref 70–99)
Potassium: 3.6 mEq/L (ref 3.5–5.1)
Sodium: 139 mEq/L (ref 135–145)
TOTAL PROTEIN: 6.8 g/dL (ref 6.0–8.3)

## 2016-01-18 LAB — MICROALBUMIN / CREATININE URINE RATIO
CREATININE, U: 388 mg/dL
MICROALB UR: 1.7 mg/dL (ref 0.0–1.9)
MICROALB/CREAT RATIO: 0.4 mg/g (ref 0.0–30.0)

## 2016-01-18 LAB — LIPID PANEL
CHOLESTEROL: 141 mg/dL (ref 0–200)
HDL: 52.2 mg/dL (ref >39.00)
LDL CALC: 82 mg/dL (ref 0–99)
NonHDL: 89.11
TRIGLYCERIDES: 34 mg/dL (ref 0.0–149.0)
Total CHOL/HDL Ratio: 3
VLDL: 6.8 mg/dL (ref 0.0–40.0)

## 2016-01-18 LAB — TSH: TSH: 0.98 u[IU]/mL (ref 0.35–4.50)

## 2016-01-18 MED ORDER — ATENOLOL 50 MG PO TABS: 50.0000 mg | ORAL_TABLET | Freq: Every day | ORAL | 1 refills | Status: DC

## 2016-01-18 MED ORDER — HYDROCORTISONE 2.5 % RE CREA: TOPICAL_CREAM | Freq: Two times a day (BID) | RECTAL | 1 refills | Status: DC

## 2016-01-18 MED ORDER — NORETHINDRONE 0.35 MG PO TABS: 1.0000 | ORAL_TABLET | Freq: Every day | ORAL | 3 refills | Status: DC

## 2016-01-18 MED ORDER — TRIAMTERENE-HCTZ 37.5-25 MG PO TABS: 1.0000 | ORAL_TABLET | Freq: Every day | ORAL | 1 refills | Status: DC

## 2016-01-18 NOTE — Progress Notes (Signed)
Patient ID: Jennifer Olsen, female   DOB: [birth year], 40 y.o.   MRN: [id number]   Subjective:    Patient ID: Jennifer Olsen, female    DOB: [birth year], 40 y.o.   MRN: [id number]  HPI  Patient here to establish care and for her physical exam. She states she is doing well.  Exercises.  Feels good.  No chest pain.  No sob.  No acid reflux.  No abdominal pain or cramping.  Bowels stable.  Had breast augmentation in January.  Doing well.  Surgery - Dr Nehemiah Massed.  Stays active.  Exercises.  Blood pressure under good control.       Past Medical History:  Diagnosis Date  . Hypertension    s/p renal artery doppler which was normal  . Persistent headaches    Past Surgical History:  Procedure Laterality Date  . FOOT SURGERY     bilateral, bunions, Dr. Elvina Mattes  . NOVASURE ABLATION  2010   Dr. Amalia Hailey  . WRIST SURGERY  2011   left wrist, fracture, Dr. Mauri Pole   Family History  Problem Relation Age of Onset  . Aneurysm Mother   . Heart disease Mother   . Cancer Mother     breast or uterine   Social History   Social History  . Marital status: Single    Spouse name: N/A  . Number of children: N/A  . Years of education: N/A   Social History Main Topics  . Smoking status: Never Smoker  . Smokeless tobacco: Never Used  . Alcohol use No  . Drug use: No  . Sexual activity: Not Asked   Other Topics Concern  . None   Social History Narrative   Lives in [place] with 3 children 16, 12, 5YO.  Dog in home.   Recently married.   Works - [employer]   Diet - regular   Exercise - walking 20-76min daily    Outpatient Encounter Prescriptions as of 01/18/2016  Medication Sig  . atenolol (TENORMIN) 50 MG tablet Take 1 tablet (50 mg total) by mouth daily.  . hydrocortisone (PROCTOSOL HC) 2.5 % rectal cream Place rectally 2 (two) times daily.  Marland Kitchen loratadine (CLARITIN) 10 MG tablet Take 10 mg by mouth daily.  . Multiple Vitamin (MULTIVITAMIN) tablet Take 1 tablet by mouth daily.  .  norethindrone (MICRONOR,CAMILA,ERRIN) 0.35 MG tablet Take 1 tablet (0.35 mg total) by mouth daily.  Marland Kitchen triamterene-hydrochlorothiazide (MAXZIDE-25) 37.5-25 MG tablet Take 1 tablet by mouth daily.  . [DISCONTINUED] atenolol (TENORMIN) 50 MG tablet Take 1 tablet (50 mg total) by mouth daily.  . [DISCONTINUED] hydrocortisone (PROCTOSOL HC) 2.5 % rectal cream Place rectally 2 (two) times daily.  . [DISCONTINUED] norethindrone (MICRONOR,CAMILA,ERRIN) 0.35 MG tablet Take 1 tablet (0.35 mg total) by mouth daily.  . [DISCONTINUED] triamterene-hydrochlorothiazide (MAXZIDE-25) 37.5-25 MG tablet Take 1 tablet by mouth daily.   No facility-administered encounter medications on file as of 01/18/2016.     Review of Systems  Constitutional: Negative for appetite change and unexpected weight change.  HENT: Negative for congestion and sinus pressure.        Some seasonal allergy issues.   Eyes: Negative for pain and visual disturbance.  Respiratory: Negative for cough, chest tightness and shortness of breath.   Cardiovascular: Negative for chest pain, palpitations and leg swelling.  Gastrointestinal: Negative for abdominal pain, diarrhea, nausea and vomiting.  Genitourinary: Negative for difficulty urinating and dysuria.  Musculoskeletal: Negative for back pain and joint swelling.  Skin:  Negative for color change and rash.  Neurological: Negative for dizziness, light-headedness and headaches.  Hematological: Negative for adenopathy. Does not bruise/bleed easily.  Psychiatric/Behavioral: Negative for agitation and dysphoric mood.       Objective:    Physical Exam  Constitutional: She is oriented to person, place, and time. She appears well-developed and well-nourished. No distress.  HENT:  Nose: Nose normal.  Mouth/Throat: Oropharynx is clear and moist.  Eyes: Right eye exhibits no discharge. Left eye exhibits no discharge. No scleral icterus.  Neck: Neck supple. No thyromegaly present.    Cardiovascular: Normal rate and regular rhythm.   Pulmonary/Chest: Breath sounds normal. No accessory muscle usage. No tachypnea. No respiratory distress. She has no decreased breath sounds. She has no wheezes. She has no rhonchi. Right breast exhibits no inverted nipple, no mass, no nipple discharge and no tenderness (no axillary adenopathy). Left breast exhibits no inverted nipple, no mass, no nipple discharge and no tenderness (no axilarry adenopathy).  Implants in place  Abdominal: Soft. Bowel sounds are normal. There is no tenderness.  Genitourinary:  Genitourinary Comments: Normal external genitalia.  Vaginal vault without lesions.  Cervix identified.  Pap smear performed.  Could not appreciate any adnexal masses or tenderness.    Musculoskeletal: She exhibits no edema or tenderness.  Lymphadenopathy:    She has no cervical adenopathy.  Neurological: She is alert and oriented to person, place, and time.  Skin: Skin is warm. No rash noted. No erythema.  Psychiatric: She has a normal mood and affect. Her behavior is normal.    BP 116/70   Pulse 68   Temp 98.5 F (36.9 C) (Oral)   Ht 5\' 7"  (1.702 m)   Wt 157 lb 3.2 oz (71.3 kg)   LMP 01/16/2016 Comment: Ablasion  SpO2 98%   BMI 24.62 kg/m  Wt Readings from Last 3 Encounters:  01/18/16 157 lb 3.2 oz (71.3 kg)  12/29/14 153 lb (69.4 kg)  12/27/13 158 lb 8 oz (71.9 kg)     Lab Results  Component Value Date   WBC 5.2 01/18/2016   HGB 14.8 01/18/2016   HCT 42.4 01/18/2016   PLT 191.0 01/18/2016   GLUCOSE 94 01/18/2016   CHOL 141 01/18/2016   TRIG 34.0 01/18/2016   HDL 52.20 01/18/2016   LDLCALC 82 01/18/2016   ALT 11 01/18/2016   AST 14 01/18/2016   NA 139 01/18/2016   K 3.6 01/18/2016   CL 105 01/18/2016   CREATININE 0.78 01/18/2016   BUN 17 01/18/2016   CO2 30 01/18/2016   TSH 0.98 01/18/2016   MICROALBUR 1.7 01/18/2016       Assessment & Plan:   Problem List Items Addressed This Visit    Contraception     On oral contraceptives.  Tolerating.  Has a documented history of headaches.  This is not an issue for her now.  Blood pressure controlled.  Does not smoke.  Discussed risk of ocp's.  Follow.        Hemorrhoids    Uses hydrocortisone cream.  Follow.  Notify me if problems.        Relevant Medications   atenolol (TENORMIN) 50 MG tablet   hydrocortisone (PROCTOSOL HC) 2.5 % rectal cream   triamterene-hydrochlorothiazide (MAXZIDE-25) 37.5-25 MG tablet   Hypertension - Primary    Blood pressure under good control.  Continue same medication regimen.  Follow pressures.  Follow metabolic panel.        Relevant Medications   atenolol (TENORMIN) 50  MG tablet   triamterene-hydrochlorothiazide (MAXZIDE-25) 37.5-25 MG tablet   Other Relevant Orders   CBC with Differential/Platelet (Completed)   Comprehensive metabolic panel (Completed)   TSH (Completed)   Lipid panel (Completed)   Microalbumin / creatinine urine ratio (Completed)   Routine general medical examination at a health care facility    Physical today 01/18/16.  PAP 01/18/16.  Will need to schedule mammogram.  Obtain records.         Other Visit Diagnoses    Cervical cancer screening       Relevant Orders   Cytology - PAP       Einar Pheasant, MD

## 2016-01-18 NOTE — Progress Notes (Signed)
Pre visit review using our clinic review tool, if applicable. No additional management support is needed unless otherwise documented below in the visit note. 

## 2016-01-20 ENCOUNTER — Encounter: Payer: Self-pay | Admitting: Internal Medicine

## 2016-01-20 NOTE — Assessment & Plan Note (Signed)
On oral contraceptives.  Tolerating.  Has a documented history of headaches.  This is not an issue for her now.  Blood pressure controlled.  Does not smoke.  Discussed risk of ocp's.  Follow.

## 2016-01-20 NOTE — Assessment & Plan Note (Signed)
Uses hydrocortisone cream.  Follow.  Notify me if problems.

## 2016-01-20 NOTE — Assessment & Plan Note (Signed)
Physical today 01/18/16.  PAP 01/18/16.  Will need to schedule mammogram.  Obtain records.

## 2016-01-20 NOTE — Assessment & Plan Note (Signed)
Blood pressure under good control.  Continue same medication regimen.  Follow pressures.  Follow metabolic panel.   

## 2016-01-22 LAB — CYTOLOGY - PAP

## 2016-10-07 ENCOUNTER — Other Ambulatory Visit: Payer: Self-pay | Admitting: Internal Medicine

## 2016-10-07 DIAGNOSIS — K649 Unspecified hemorrhoids: Secondary | ICD-10-CM

## 2016-11-11 ENCOUNTER — Telehealth: Payer: Self-pay | Admitting: Internal Medicine

## 2016-11-11 DIAGNOSIS — Z13220 Encounter for screening for lipoid disorders: Secondary | ICD-10-CM

## 2016-11-11 DIAGNOSIS — I10 Essential (primary) hypertension: Secondary | ICD-10-CM

## 2016-11-11 NOTE — Telephone Encounter (Signed)
Patient has CPE app on 02/02/17 let me know if ok and I will call for app.

## 2016-11-11 NOTE — Telephone Encounter (Signed)
Pt would like to get fasting labs done before her appt. Need order please and thank you!

## 2016-11-12 NOTE — Telephone Encounter (Signed)
Labs ordered.  Ok to schedule lab appt before CPE.

## 2016-11-13 NOTE — Telephone Encounter (Signed)
Mailbox full

## 2016-11-14 NOTE — Telephone Encounter (Signed)
Spoke to patient. Lab appointment has been made.

## 2017-01-27 ENCOUNTER — Other Ambulatory Visit: Payer: Self-pay | Admitting: Internal Medicine

## 2017-01-28 ENCOUNTER — Other Ambulatory Visit (INDEPENDENT_AMBULATORY_CARE_PROVIDER_SITE_OTHER): Payer: 59

## 2017-01-28 DIAGNOSIS — I10 Essential (primary) hypertension: Secondary | ICD-10-CM

## 2017-01-28 DIAGNOSIS — Z13220 Encounter for screening for lipoid disorders: Secondary | ICD-10-CM

## 2017-01-28 LAB — LIPID PANEL
CHOLESTEROL: 167 mg/dL (ref 0–200)
HDL: 65.1 mg/dL (ref >39.00)
LDL CALC: 85 mg/dL (ref 0–99)
NonHDL: 102.39
TRIGLYCERIDES: 86 mg/dL (ref 0.0–149.0)
Total CHOL/HDL Ratio: 3
VLDL: 17.2 mg/dL (ref 0.0–40.0)

## 2017-01-28 LAB — CBC WITH DIFFERENTIAL/PLATELET
BASOS PCT: 0.7 % (ref 0.0–3.0)
Basophils Absolute: 0 10*3/uL (ref 0.0–0.1)
EOS ABS: 0.1 10*3/uL (ref 0.0–0.7)
EOS PCT: 1.8 % (ref 0.0–5.0)
HCT: 44.7 % (ref 36.0–46.0)
Hemoglobin: 14.8 g/dL (ref 12.0–15.0)
LYMPHS PCT: 34.2 % (ref 12.0–46.0)
Lymphs Abs: 1.8 10*3/uL (ref 0.7–4.0)
MCHC: 33.1 g/dL (ref 30.0–36.0)
MCV: 93.6 fl (ref 78.0–100.0)
MONO ABS: 0.6 10*3/uL (ref 0.1–1.0)
Monocytes Relative: 11.6 % (ref 3.0–12.0)
NEUTROS ABS: 2.8 10*3/uL (ref 1.4–7.7)
Neutrophils Relative %: 51.7 % (ref 43.0–77.0)
PLATELETS: 192 10*3/uL (ref 150.0–400.0)
RBC: 4.77 Mil/uL (ref 3.87–5.11)
RDW: 12.8 % (ref 11.5–15.5)
WBC: 5.4 10*3/uL (ref 4.0–10.5)

## 2017-01-28 LAB — COMPREHENSIVE METABOLIC PANEL
ALT: 13 U/L (ref 0–35)
AST: 16 U/L (ref 0–37)
Albumin: 4.3 g/dL (ref 3.5–5.2)
Alkaline Phosphatase: 46 U/L (ref 39–117)
BUN: 18 mg/dL (ref 6–23)
CALCIUM: 9.2 mg/dL (ref 8.4–10.5)
CHLORIDE: 101 meq/L (ref 96–112)
CO2: 30 meq/L (ref 19–32)
CREATININE: 0.78 mg/dL (ref 0.40–1.20)
GFR: 86.47 mL/min (ref >60.00)
Glucose, Bld: 92 mg/dL (ref 70–99)
POTASSIUM: 3.8 meq/L (ref 3.5–5.1)
Sodium: 139 mEq/L (ref 135–145)
Total Bilirubin: 0.7 mg/dL (ref 0.2–1.2)
Total Protein: 6.6 g/dL (ref 6.0–8.3)

## 2017-01-28 LAB — TSH: TSH: 1.01 u[IU]/mL (ref 0.35–4.50)

## 2017-02-02 ENCOUNTER — Encounter: Payer: Self-pay | Admitting: Internal Medicine

## 2017-02-02 ENCOUNTER — Ambulatory Visit (INDEPENDENT_AMBULATORY_CARE_PROVIDER_SITE_OTHER): Payer: 59 | Admitting: Internal Medicine

## 2017-02-02 VITALS — BP 118/88 | HR 63 | Temp 98.8°F | Resp 12 | Ht 67.0 in | Wt 160.5 lb

## 2017-02-02 DIAGNOSIS — K649 Unspecified hemorrhoids: Secondary | ICD-10-CM | POA: Diagnosis not present

## 2017-02-02 DIAGNOSIS — I10 Essential (primary) hypertension: Secondary | ICD-10-CM

## 2017-02-02 DIAGNOSIS — Z1231 Encounter for screening mammogram for malignant neoplasm of breast: Secondary | ICD-10-CM | POA: Diagnosis not present

## 2017-02-02 DIAGNOSIS — Z1239 Encounter for other screening for malignant neoplasm of breast: Secondary | ICD-10-CM

## 2017-02-02 DIAGNOSIS — Z0000 Encounter for general adult medical examination without abnormal findings: Secondary | ICD-10-CM | POA: Diagnosis not present

## 2017-02-02 NOTE — Progress Notes (Signed)
Patient ID: Jennifer Olsen, female   DOB: 1976/05/07, 41 y.o.   MRN: [id number]   Subjective:    Patient ID: Jennifer Olsen, female    DOB: 12-31-1975, 41 y.o.   MRN: [id number]  HPI  Patient with past history of hypertension.  She comes in today to follow up on this as well as for a complete physical exam.  She is doing well.   Feels good.  Stays active.  No chest pain.  No sob.  No acid reflux.  No abdominal pain.  Bowels moving.     Past Medical History:  Diagnosis Date  . Hypertension    s/p renal artery doppler which was normal  . Persistent headaches    Past Surgical History:  Procedure Laterality Date  . FOOT SURGERY     bilateral, bunions, Dr. Elvina Mattes  . NOVASURE ABLATION  2010   Dr. Amalia Hailey  . WRIST SURGERY  2011   left wrist, fracture, Dr. Mauri Pole   Family History  Problem Relation Age of Onset  . Aneurysm Mother   . Heart disease Mother   . Cancer Mother        breast or uterine   Social History   Social History  . Marital status: Single    Spouse name: N/A  . Number of children: N/A  . Years of education: N/A   Social History Main Topics  . Smoking status: Never Smoker  . Smokeless tobacco: Never Used  . Alcohol use No  . Drug use: No  . Sexual activity: Not Asked   Other Topics Concern  . None   Social History Narrative   Lives in [place] with 3 children 16, 12, 5YO.  Dog in home.   Recently married.   Works - [employer]   Diet - regular   Exercise - walking 20-74min daily    Outpatient Encounter Prescriptions as of 02/02/2017  Medication Sig  . atenolol (TENORMIN) 50 MG tablet Take 1 tablet (50 mg total) by mouth daily.  . Multiple Vitamin (MULTIVITAMIN) tablet Take 1 tablet by mouth daily.  Marland Kitchen PROCTOSOL HC 2.5 % rectal cream PLACE RECTALLY 2 TIMES DAILY.  Marland Kitchen triamterene-hydrochlorothiazide (MAXZIDE-25) 37.5-25 MG tablet TAKE 1 TABLET BY MOUTH DAILY.  . [DISCONTINUED] atenolol (TENORMIN) 50 MG tablet TAKE 1 TABLET BY MOUTH DAILY.  (Patient not taking: Reported on 02/02/2017)  . [DISCONTINUED] loratadine (CLARITIN) 10 MG tablet Take 10 mg by mouth daily.  . [DISCONTINUED] norethindrone (MICRONOR,CAMILA,ERRIN) 0.35 MG tablet Take 1 tablet (0.35 mg total) by mouth daily. (Patient not taking: Reported on 02/02/2017)   No facility-administered encounter medications on file as of 02/02/2017.     Review of Systems  Constitutional: Negative for appetite change and unexpected weight change.  HENT: Negative for congestion and sinus pressure.   Eyes: Negative for pain and visual disturbance.  Respiratory: Negative for cough, chest tightness and shortness of breath.   Cardiovascular: Negative for chest pain, palpitations and leg swelling.  Gastrointestinal: Negative for abdominal pain, diarrhea, nausea and vomiting.  Genitourinary: Negative for difficulty urinating and dysuria.  Musculoskeletal: Negative for back pain and joint swelling.  Skin: Negative for color change and rash.  Neurological: Negative for dizziness, light-headedness and headaches.  Hematological: Negative for adenopathy. Does not bruise/bleed easily.  Psychiatric/Behavioral: Negative for agitation and dysphoric mood.       Objective:    Physical Exam  Constitutional: She is oriented to person, place, and time. She appears well-developed and well-nourished. No distress.  HENT:  Nose: Nose normal.  Mouth/Throat: Oropharynx is clear and moist.  Eyes: Right eye exhibits no discharge. Left eye exhibits no discharge. No scleral icterus.  Neck: Neck supple. No thyromegaly present.  Cardiovascular: Normal rate and regular rhythm.   Pulmonary/Chest: Breath sounds normal. No accessory muscle usage. No tachypnea. No respiratory distress. She has no decreased breath sounds. She has no wheezes. She has no rhonchi. Right breast exhibits no inverted nipple, no mass, no nipple discharge and no tenderness (no axillary adenopathy). Left breast exhibits no inverted nipple,  no mass, no nipple discharge and no tenderness (no axilarry adenopathy).  Abdominal: Soft. Bowel sounds are normal. There is no tenderness.  Musculoskeletal: She exhibits no edema or tenderness.  Lymphadenopathy:    She has no cervical adenopathy.  Neurological: She is alert and oriented to person, place, and time.  Skin: Skin is warm. No rash noted. No erythema.  Psychiatric: She has a normal mood and affect. Her behavior is normal.    BP 118/88 (BP Location: Left Arm, Patient Position: Sitting, Cuff Size: Normal)   Pulse 63   Temp 98.8 F (37.1 C) (Oral)   Resp 12   Ht 5\' 7"  (1.702 m)   Wt 160 lb 8 oz (72.8 kg)   SpO2 97%   BMI 25.14 kg/m  Wt Readings from Last 3 Encounters:  02/02/17 160 lb 8 oz (72.8 kg)  01/18/16 157 lb 3.2 oz (71.3 kg)  12/29/14 153 lb (69.4 kg)     Lab Results  Component Value Date   WBC 5.4 01/28/2017   HGB 14.8 01/28/2017   HCT 44.7 01/28/2017   PLT 192.0 01/28/2017   GLUCOSE 92 01/28/2017   CHOL 167 01/28/2017   TRIG 86.0 01/28/2017   HDL 65.10 01/28/2017   LDLCALC 85 01/28/2017   ALT 13 01/28/2017   AST 16 01/28/2017   NA 139 01/28/2017   K 3.8 01/28/2017   CL 101 01/28/2017   CREATININE 0.78 01/28/2017   BUN 18 01/28/2017   CO2 30 01/28/2017   TSH 1.01 01/28/2017   MICROALBUR 1.7 01/18/2016       Assessment & Plan:   Problem List Items Addressed This Visit    Hemorrhoids    No problems reported today.  Follow.        Hypertension    Blood pressure on recheck improved.  Continue current medication regimen.  Follow pressures.  Follow metabolic panel.        Routine general medical examination at a health care facility    Physical today 02/02/17.  PAP 01/18/16 - negative with negative HPV.  Schedule mammogram.  Discussed with Dr Tula Nakayama.  Recommended screening mammogram.         Other Visit Diagnoses    Breast cancer screening    -  Primary   Relevant Orders   MM DIGITAL SCREENING BILATERAL       Einar Pheasant, MD

## 2017-02-05 ENCOUNTER — Encounter: Payer: Self-pay | Admitting: Internal Medicine

## 2017-02-05 NOTE — Assessment & Plan Note (Addendum)
Physical today 02/02/17.  PAP 01/18/16 - negative with negative HPV.  Schedule mammogram.  Discussed with Dr Tula Nakayama.  Recommended screening mammogram.

## 2017-02-05 NOTE — Assessment & Plan Note (Signed)
Blood pressure on recheck improved.  Continue current medication regimen.  Follow pressures.  Follow metabolic panel.   

## 2017-02-05 NOTE — Assessment & Plan Note (Signed)
No problems reported today.  Follow.

## 2017-02-06 ENCOUNTER — Telehealth: Payer: Self-pay

## 2017-02-06 NOTE — Telephone Encounter (Signed)
Patient scheduled for mammogram at [facility] 02/19/17 @ 11:00 am .  Left message to call.

## 2017-02-12 NOTE — Telephone Encounter (Signed)
Left voice mail to call back 

## 2017-02-19 ENCOUNTER — Ambulatory Visit
Admission: RE | Admit: 2017-02-19 | Discharge: 2017-02-19 | Disposition: A | Discharge status: Home / Self Care | Payer: 59 | Source: Ambulatory Visit | Attending: Internal Medicine | Admitting: Internal Medicine

## 2017-02-19 DIAGNOSIS — Z1231 Encounter for screening mammogram for malignant neoplasm of breast: Secondary | ICD-10-CM | POA: Diagnosis not present

## 2017-02-19 DIAGNOSIS — Z1239 Encounter for other screening for malignant neoplasm of breast: Secondary | ICD-10-CM

## 2017-02-19 IMAGING — MG DIGITAL SCREENING BILATERAL MAMMOGRAM WITH IMPLANTS AND CAD
9 series · 9 of 9 positions shown · non-contrast
Comparison: None.

CLINICAL DATA: Screening.

EXAM:
DIGITAL SCREENING BILATERAL MAMMOGRAM WITH IMPLANTS AND CAD
The patient has retropectoral implants. Standard and implant
displaced views were performed.

[R MLO (1 of 2)]
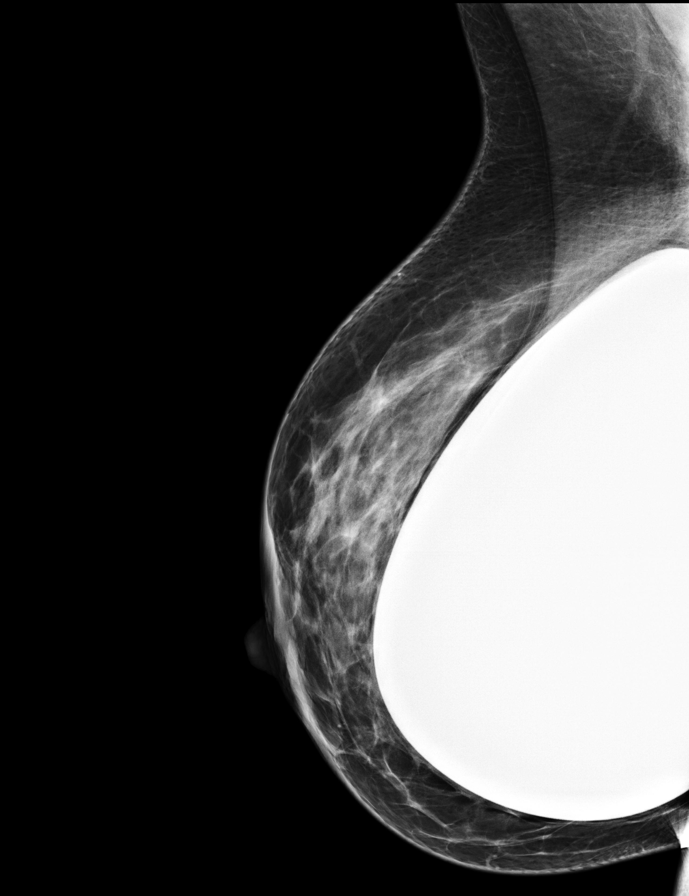

[R CC (1 of 3)]
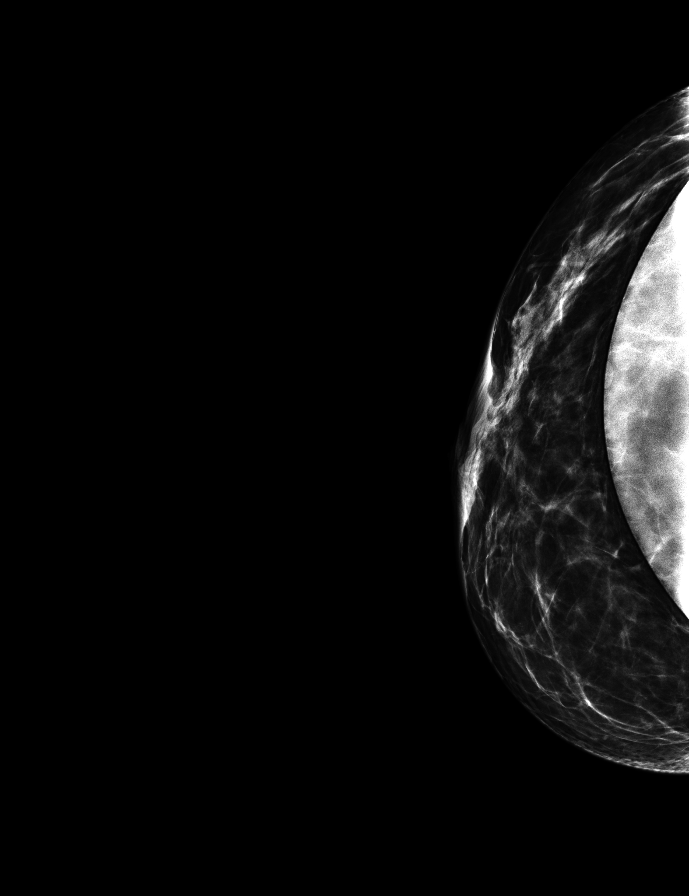

[L CC (1 of 2)]
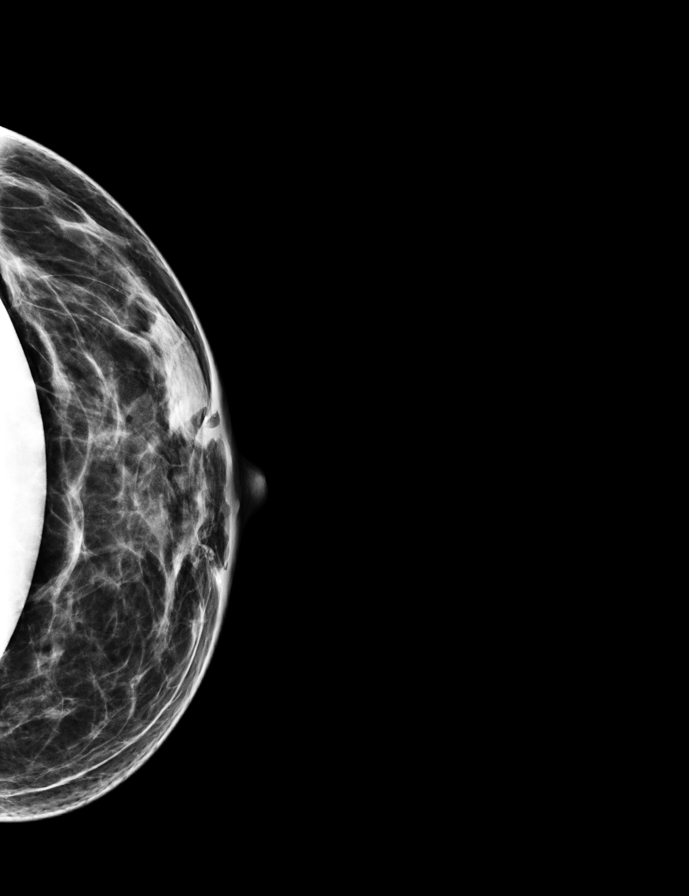

[R MLO (2 of 2)]
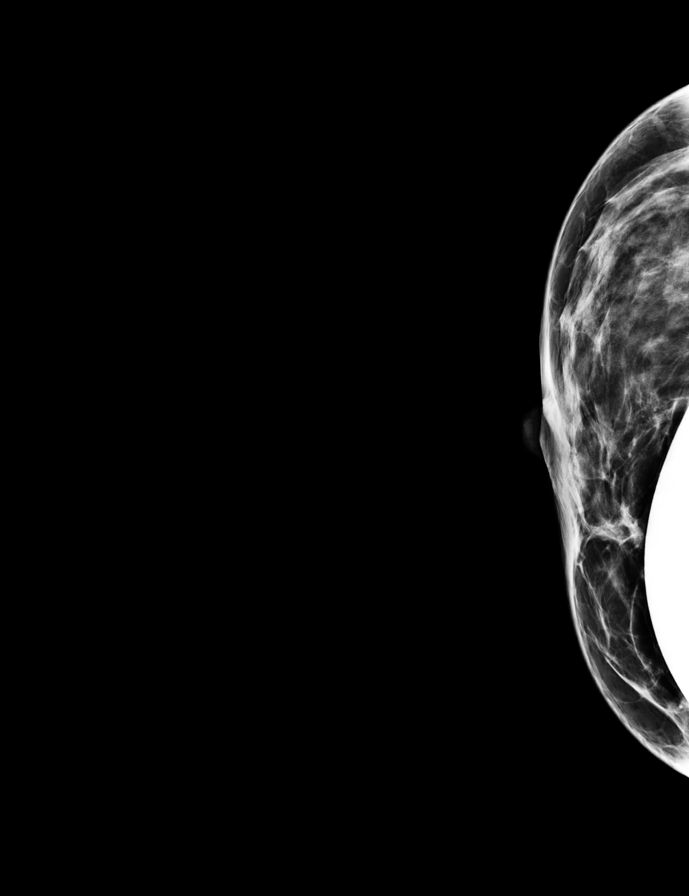

[L CC (2 of 2)]
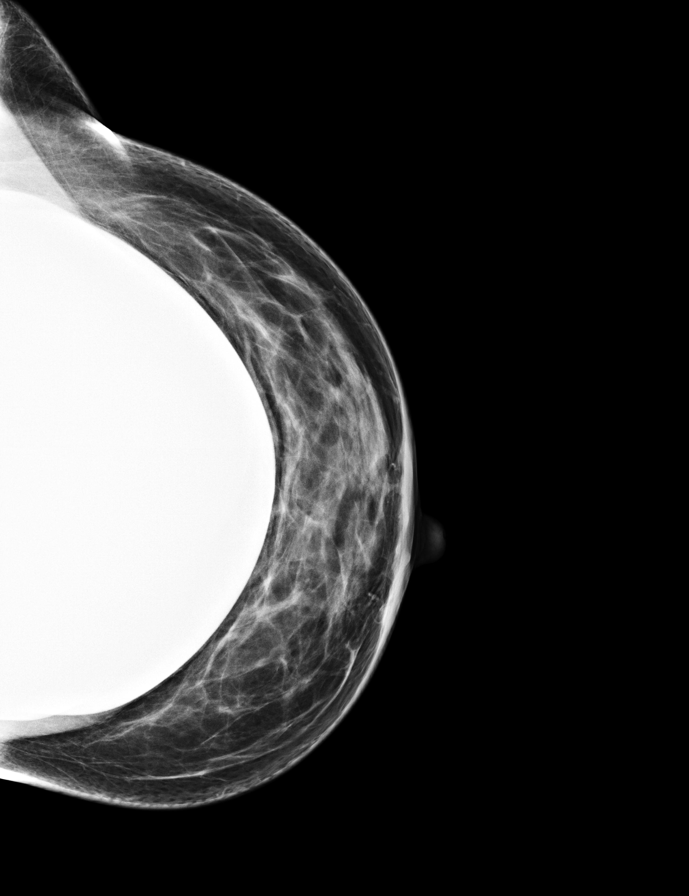

[L MLO (1 of 2)]
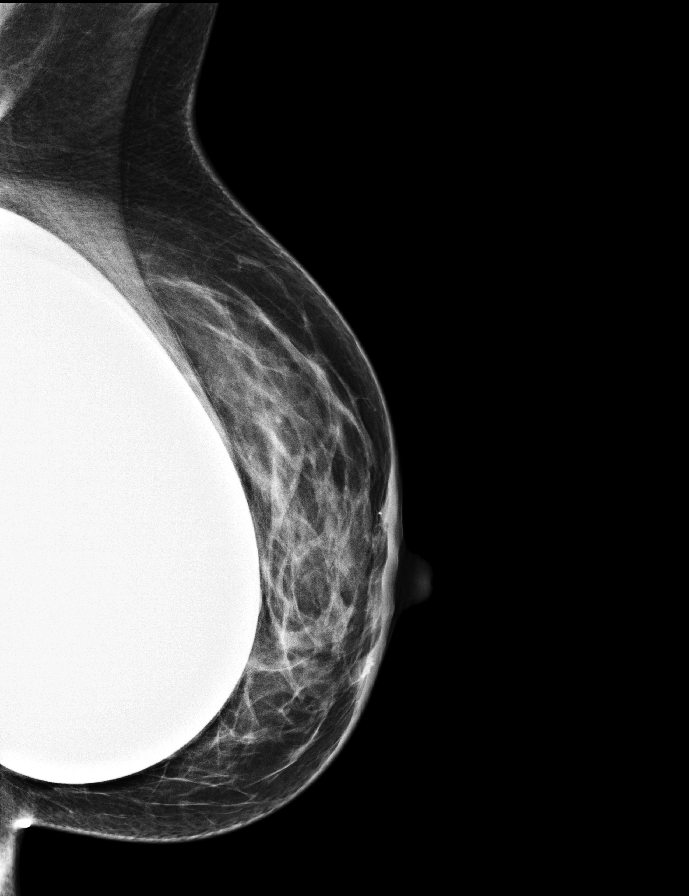

[R CC (2 of 3)]
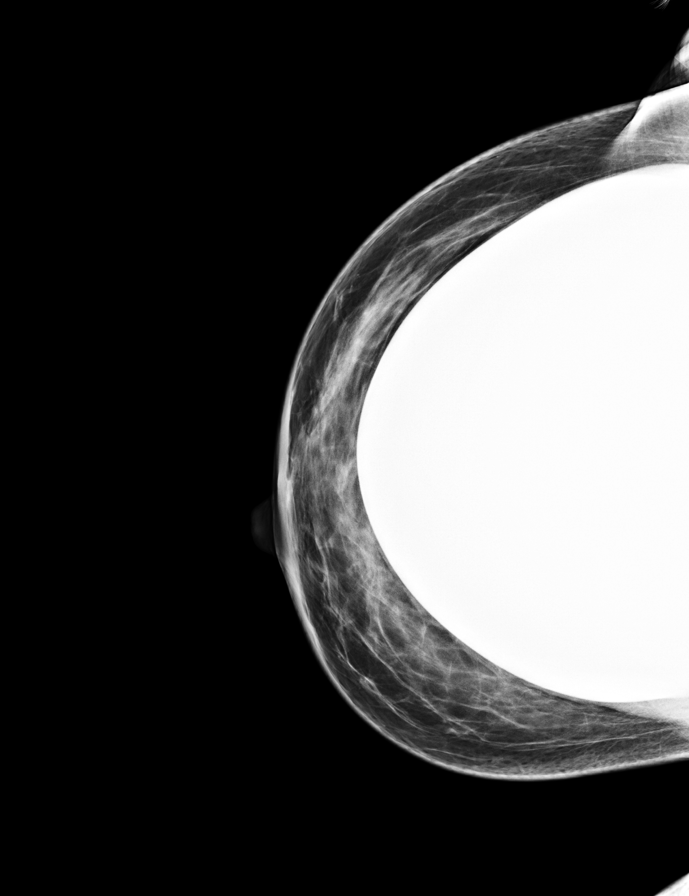

[R CC (3 of 3)]
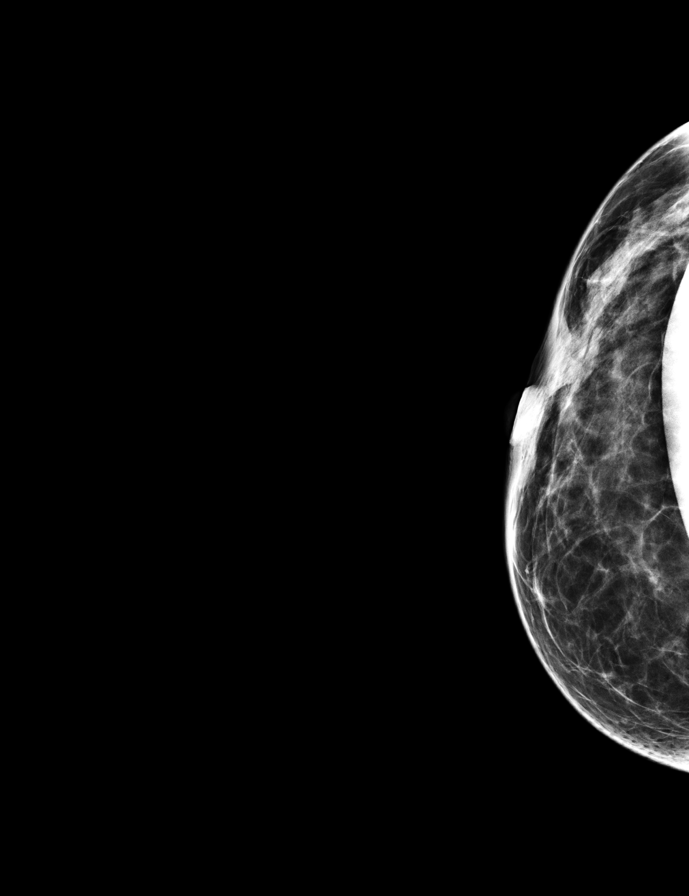

[L MLO (2 of 2)]
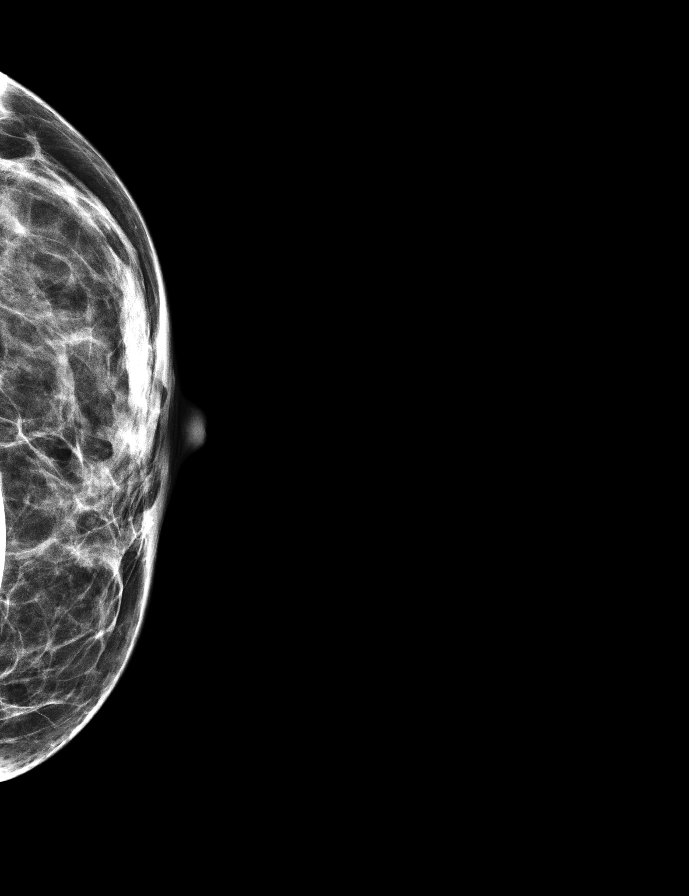

[9 of 9 positions shown; findings below may reference images not displayed]

ACR Breast Density Category c: The breast tissue is heterogeneously
dense, which may obscure small masses.
FINDINGS: There are no findings suspicious for malignancy. Images were
processed with CAD.
IMPRESSION: No mammographic evidence of malignancy. A result letter of this
screening mammogram will be mailed directly to the patient.

RECOMMENDATION:
Screening mammogram in one year. (Code:[DM])

BI-RADS CATEGORY  1:  Negative.

## 2017-04-06 ENCOUNTER — Other Ambulatory Visit: Payer: Self-pay | Admitting: Internal Medicine

## 2017-04-06 DIAGNOSIS — K649 Unspecified hemorrhoids: Secondary | ICD-10-CM

## 2017-04-06 MED ORDER — HYDROCORTISONE 2.5 % RE CREA: TOPICAL_CREAM | RECTAL | 1 refills | Status: DC

## 2017-04-06 MED ORDER — ATENOLOL 50 MG PO TABS: 50.0000 mg | ORAL_TABLET | Freq: Every day | ORAL | 1 refills | Status: DC

## 2017-04-06 NOTE — Telephone Encounter (Signed)
Wrong office

## 2017-04-06 NOTE — Progress Notes (Signed)
rx sent in for proctosol.

## 2017-04-06 NOTE — Telephone Encounter (Signed)
Copied from [place] [phone]. Topic: Quick Communication - Rx Refill/Question >> Apr 06, 2017 12:49 PM Clack, Laban Emperor wrote: Has the patient contacted their pharmacy? Yes.     (Agent: If no, request that the patient contact the pharmacy for the refill.)   Preferred Pharmacy (with phone number or street name):[facility], [place] [phone] (Phone) [phone] (Fax)   Agent: Please be advised that RX refills may take up to 48 hours. We ask that you follow-up with your pharmacy.  Pt states that she is suppose to have 3 medication called into the pharmacy on her last visit 9/24, adv pt I only see two medication.  She is requesting a med refill on all 3, again the only two is show is triamterene and proctosol. Pt states that she took her last triamterene today.

## 2017-09-01 ENCOUNTER — Other Ambulatory Visit: Payer: Self-pay | Admitting: Internal Medicine

## 2017-09-01 DIAGNOSIS — K649 Unspecified hemorrhoids: Secondary | ICD-10-CM

## 2017-09-01 NOTE — Telephone Encounter (Signed)
Okay to refill? Last seen in September 2018

## 2017-09-01 NOTE — Telephone Encounter (Signed)
ok'd refill for hydrocortisone x 1.

## 2017-09-02 DIAGNOSIS — M79675 Pain in left toe(s): Secondary | ICD-10-CM | POA: Diagnosis not present

## 2017-09-02 DIAGNOSIS — L600 Ingrowing nail: Secondary | ICD-10-CM | POA: Diagnosis not present

## 2017-09-30 ENCOUNTER — Other Ambulatory Visit: Payer: Self-pay | Admitting: Internal Medicine

## 2017-11-24 ENCOUNTER — Other Ambulatory Visit: Payer: Self-pay | Admitting: Internal Medicine

## 2017-11-27 ENCOUNTER — Other Ambulatory Visit: Payer: Self-pay | Admitting: Internal Medicine

## 2017-11-27 DIAGNOSIS — K649 Unspecified hemorrhoids: Secondary | ICD-10-CM

## 2017-11-30 DIAGNOSIS — M79675 Pain in left toe(s): Secondary | ICD-10-CM | POA: Diagnosis not present

## 2017-11-30 DIAGNOSIS — L600 Ingrowing nail: Secondary | ICD-10-CM | POA: Diagnosis not present

## 2017-12-01 NOTE — Telephone Encounter (Signed)
Ok to refill. LOV 01/2017

## 2017-12-02 NOTE — Telephone Encounter (Signed)
ok'd refill x 1.  Please schedule her physical - after 02/02/18.

## 2018-02-22 ENCOUNTER — Other Ambulatory Visit: Payer: Self-pay | Admitting: Internal Medicine

## 2018-02-25 NOTE — Telephone Encounter (Signed)
Patient is calling about this medication being denied. She states the pharmacy denied it and I show she needs an appointment. The patient is scheduled for her physical on 03/17/18 and has 2 pills left. Please advise.

## 2018-03-17 ENCOUNTER — Encounter: Payer: Self-pay | Admitting: Internal Medicine

## 2018-03-17 ENCOUNTER — Encounter

## 2018-03-17 ENCOUNTER — Ambulatory Visit (INDEPENDENT_AMBULATORY_CARE_PROVIDER_SITE_OTHER): Payer: 59 | Admitting: Internal Medicine

## 2018-03-17 VITALS — BP 118/70 | HR 60 | Temp 98.4°F | Resp 18 | Ht 67.5 in | Wt 167.2 lb

## 2018-03-17 DIAGNOSIS — Z1231 Encounter for screening mammogram for malignant neoplasm of breast: Secondary | ICD-10-CM

## 2018-03-17 DIAGNOSIS — I10 Essential (primary) hypertension: Secondary | ICD-10-CM

## 2018-03-17 DIAGNOSIS — Z13220 Encounter for screening for lipoid disorders: Secondary | ICD-10-CM

## 2018-03-17 DIAGNOSIS — Z1239 Encounter for other screening for malignant neoplasm of breast: Secondary | ICD-10-CM | POA: Diagnosis not present

## 2018-03-17 DIAGNOSIS — Z0000 Encounter for general adult medical examination without abnormal findings: Secondary | ICD-10-CM

## 2018-03-17 DIAGNOSIS — Z23 Encounter for immunization: Secondary | ICD-10-CM | POA: Diagnosis not present

## 2018-03-17 DIAGNOSIS — K649 Unspecified hemorrhoids: Secondary | ICD-10-CM | POA: Diagnosis not present

## 2018-03-17 LAB — CBC WITH DIFFERENTIAL/PLATELET
Basophils Absolute: 0 10*3/uL (ref 0.0–0.1)
Basophils Relative: 0.6 % (ref 0.0–3.0)
EOS ABS: 0.1 10*3/uL (ref 0.0–0.7)
Eosinophils Relative: 2.1 % (ref 0.0–5.0)
HEMATOCRIT: 43.3 % (ref 36.0–46.0)
HEMOGLOBIN: 14.8 g/dL (ref 12.0–15.0)
LYMPHS PCT: 35.7 % (ref 12.0–46.0)
Lymphs Abs: 1.8 10*3/uL (ref 0.7–4.0)
MCHC: 34.2 g/dL (ref 30.0–36.0)
MCV: 91.9 fl (ref 78.0–100.0)
MONOS PCT: 12 % (ref 3.0–12.0)
Monocytes Absolute: 0.6 10*3/uL (ref 0.1–1.0)
Neutro Abs: 2.5 10*3/uL (ref 1.4–7.7)
Neutrophils Relative %: 49.6 % (ref 43.0–77.0)
Platelets: 192 10*3/uL (ref 150.0–400.0)
RBC: 4.72 Mil/uL (ref 3.87–5.11)
RDW: 13 % (ref 11.5–15.5)
WBC: 5 10*3/uL (ref 4.0–10.5)

## 2018-03-17 LAB — LIPID PANEL
CHOL/HDL RATIO: 3
Cholesterol: 148 mg/dL (ref 0–200)
HDL: 53.9 mg/dL (ref >39.00)
LDL Cholesterol: 78 mg/dL (ref 0–99)
NonHDL: 94.16
TRIGLYCERIDES: 81 mg/dL (ref 0.0–149.0)
VLDL: 16.2 mg/dL (ref 0.0–40.0)

## 2018-03-17 LAB — COMPREHENSIVE METABOLIC PANEL
ALBUMIN: 4.4 g/dL (ref 3.5–5.2)
ALK PHOS: 48 U/L (ref 39–117)
ALT: 14 U/L (ref 0–35)
AST: 19 U/L (ref 0–37)
BILIRUBIN TOTAL: 0.8 mg/dL (ref 0.2–1.2)
BUN: 17 mg/dL (ref 6–23)
CALCIUM: 9.6 mg/dL (ref 8.4–10.5)
CO2: 29 mEq/L (ref 19–32)
CREATININE: 0.75 mg/dL (ref 0.40–1.20)
Chloride: 104 mEq/L (ref 96–112)
GFR: 89.98 mL/min (ref >60.00)
Glucose, Bld: 95 mg/dL (ref 70–99)
Potassium: 4.6 mEq/L (ref 3.5–5.1)
Sodium: 140 mEq/L (ref 135–145)
TOTAL PROTEIN: 7.1 g/dL (ref 6.0–8.3)

## 2018-03-17 LAB — TSH: TSH: 1.15 u[IU]/mL (ref 0.35–4.50)

## 2018-03-17 MED ORDER — TRIAMTERENE-HCTZ 37.5-25 MG PO TABS: 1.0000 | ORAL_TABLET | Freq: Every day | ORAL | 3 refills | Status: DC

## 2018-03-17 MED ORDER — ATENOLOL 50 MG PO TABS: 50.0000 mg | ORAL_TABLET | Freq: Every day | ORAL | 3 refills | Status: DC

## 2018-03-17 MED ORDER — HYDROCORTISONE 2.5 % EX CREA: TOPICAL_CREAM | CUTANEOUS | 3 refills | Status: DC

## 2018-03-17 NOTE — Progress Notes (Signed)
Patient ID: Jennifer Olsen, female   DOB: 03-11-1976, 42 y.o.   MRN: [id number]   Subjective:    Patient ID: Jennifer Olsen, female    DOB: 09/22/1975, 42 y.o.   MRN: [id number]  HPI  Patient here for her physical exam.   States she feels good.  Stays active.  Exercising.  No chest pain.  No sob.  No acid reflux. No abdominal pain.  Bowels moving.  Overall feels good.     Past Medical History:  Diagnosis Date  . Hypertension    s/p renal artery doppler which was normal  . Persistent headaches    Past Surgical History:  Procedure Laterality Date  . AUGMENTATION MAMMAPLASTY Bilateral 05/2015   saline  . FOOT SURGERY     bilateral, bunions, Dr. Elvina Mattes  . NOVASURE ABLATION  2010   Dr. Amalia Hailey  . WRIST SURGERY  2011   left wrist, fracture, Dr. Mauri Pole   Family History  Problem Relation Age of Onset  . Aneurysm Mother   . Heart disease Mother    Social History   Socioeconomic History  . Marital status: Single    Spouse name: Not on file  . Number of children: Not on file  . Years of education: Not on file  . Highest education level: Not on file  Occupational History  . Not on file  Social Needs  . Financial resource strain: Not on file  . Food insecurity:    Worry: Not on file    Inability: Not on file  . Transportation needs:    Medical: Not on file    Non-medical: Not on file  Tobacco Use  . Smoking status: Never Smoker  . Smokeless tobacco: Never Used  Substance and Sexual Activity  . Alcohol use: No  . Drug use: No  . Sexual activity: Not on file  Lifestyle  . Physical activity:    Days per week: Not on file    Minutes per session: Not on file  . Stress: Not on file  Relationships  . Social connections:    Talks on phone: Not on file    Gets together: Not on file    Attends religious service: Not on file    Active member of club or organization: Not on file    Attends meetings of clubs or organizations: Not on file    Relationship status: Not on file  Other  Topics Concern  . Not on file  Social History Narrative   Lives in [place] with 3 children 16, 12, 5YO.  Dog in home.   Recently married.   Works - [employer]   Diet - regular   Exercise - walking 20-78min daily    Outpatient Encounter Medications as of 03/17/2018  Medication Sig  . atenolol (TENORMIN) 50 MG tablet Take 1 tablet (50 mg total) by mouth daily.  . hydrocortisone 2.5 % cream Apply to affected area as directed  . Multiple Vitamin (MULTIVITAMIN) tablet Take 1 tablet by mouth daily.  Marland Kitchen triamterene-hydrochlorothiazide (MAXZIDE-25) 37.5-25 MG tablet Take 1 tablet by mouth daily.  . [DISCONTINUED] atenolol (TENORMIN) 50 MG tablet TAKE 1 TABLET (50 MG TOTAL) BY MOUTH DAILY. **MUST MAKE AN APPOINTMENT**  . [DISCONTINUED] hydrocortisone 2.5 % cream PLACE RECTALLY 2 TIMES DAILY.  . [DISCONTINUED] triamterene-hydrochlorothiazide (MAXZIDE-25) 37.5-25 MG tablet TAKE 1 TABLET BY MOUTH DAILY.  . [DISCONTINUED] hydrocortisone (PROCTOSOL HC) 2.5 % rectal cream PLACE RECTALLY 2 TIMES DAILY.   No facility-administered encounter medications on file  as of 03/17/2018.     Review of Systems  Constitutional: Negative for appetite change and unexpected weight change.  HENT: Negative for congestion and sinus pressure.   Eyes: Negative for pain and visual disturbance.  Respiratory: Negative for cough, chest tightness and shortness of breath.   Cardiovascular: Negative for chest pain, palpitations and leg swelling.  Gastrointestinal: Negative for abdominal pain, diarrhea, nausea and vomiting.  Genitourinary: Negative for difficulty urinating and dysuria.  Musculoskeletal: Negative for joint swelling and myalgias.  Skin: Negative for color change and rash.  Neurological: Negative for dizziness, light-headedness and headaches.  Hematological: Negative for adenopathy. Does not bruise/bleed easily.  Psychiatric/Behavioral: Negative for agitation and dysphoric mood.       Objective:      Physical Exam  Constitutional: She appears well-developed and well-nourished. No distress.  HENT:  Nose: Nose normal.  Mouth/Throat: Oropharynx is clear and moist.  Eyes: Conjunctivae are normal. Right eye exhibits no discharge. Left eye exhibits no discharge.  Neck: Neck supple. No thyromegaly present.  Cardiovascular: Normal rate and regular rhythm.  Pulmonary/Chest: Breath sounds normal. No respiratory distress. She has no wheezes.  Breasts:  Implants in place.  No nipple discharge or nipple retraction present.  Could not appreciate any distinct nodules or axillary adenopathy.    Abdominal: Soft. Bowel sounds are normal. There is no tenderness.  Musculoskeletal: She exhibits no edema or tenderness.  Lymphadenopathy:    She has no cervical adenopathy.  Skin: No rash noted. No erythema.  Psychiatric: She has a normal mood and affect. Her behavior is normal.    BP 118/70 (BP Location: Left Arm, Patient Position: Sitting, Cuff Size: Normal)   Pulse 60   Temp 98.4 F (36.9 C) (Oral)   Resp 18   Ht 5' 7.5" (1.715 m)   Wt 167 lb 3.2 oz (75.8 kg)   SpO2 99%   BMI 25.80 kg/m  Wt Readings from Last 3 Encounters:  03/17/18 167 lb 3.2 oz (75.8 kg)  02/02/17 160 lb 8 oz (72.8 kg)  01/18/16 157 lb 3.2 oz (71.3 kg)     Lab Results  Component Value Date   WBC 5.0 03/17/2018   HGB 14.8 03/17/2018   HCT 43.3 03/17/2018   PLT 192.0 03/17/2018   GLUCOSE 95 03/17/2018   CHOL 148 03/17/2018   TRIG 81.0 03/17/2018   HDL 53.90 03/17/2018   LDLCALC 78 03/17/2018   ALT 14 03/17/2018   AST 19 03/17/2018   NA 140 03/17/2018   K 4.6 03/17/2018   CL 104 03/17/2018   CREATININE 0.75 03/17/2018   BUN 17 03/17/2018   CO2 29 03/17/2018   TSH 1.15 03/17/2018   MICROALBUR 1.7 01/18/2016    Mm Digital Screening W/ Implants Bilateral  Result Date: 02/19/2017 CLINICAL DATA:  Screening. EXAM: DIGITAL SCREENING BILATERAL MAMMOGRAM WITH IMPLANTS AND CAD The patient has retropectoral  implants. Standard and implant displaced views were performed. COMPARISON:  None. ACR Breast Density Category c: The breast tissue is heterogeneously dense, which may obscure small masses. FINDINGS: There are no findings suspicious for malignancy. Images were processed with CAD. IMPRESSION: No mammographic evidence of malignancy. A result letter of this screening mammogram will be mailed directly to the patient. RECOMMENDATION: Screening mammogram in one year. (Code:SM-B-01Y) BI-RADS CATEGORY  1:  Negative. Electronically Signed   By: Lillia Mountain M.D.   On: 02/19/2017 11:33       Assessment & Plan:   Problem List Items Addressed This Visit  Hemorrhoids   Relevant Medications   atenolol (TENORMIN) 50 MG tablet   triamterene-hydrochlorothiazide (MAXZIDE-25) 37.5-25 MG tablet   hydrocortisone 2.5 % cream   Hypertension    Blood pressure under good control.  Continue same medication regimen.  Follow pressures.  Follow metabolic panel.        Relevant Medications   atenolol (TENORMIN) 50 MG tablet   triamterene-hydrochlorothiazide (MAXZIDE-25) 37.5-25 MG tablet   Other Relevant Orders   CBC with Differential/Platelet (Completed)   Comprehensive metabolic panel (Completed)   TSH (Completed)   Routine general medical examination at a health care facility - Primary    Physical today 03/17/18.  PAP 01/18/16 - negative with negative HPV.  Mammogram - scheduled for 03/25/18.         Other Visit Diagnoses    Visit for screening mammogram       Relevant Orders   MM 3D SCREEN BREAST W/IMPLANT BILATERAL   Breast cancer screening       Screening cholesterol level       Relevant Orders   Lipid panel (Completed)   Need for Tdap vaccination       Relevant Orders   Tdap vaccine greater than or equal to 7yo IM (Completed)       Einar Pheasant, MD

## 2018-03-20 ENCOUNTER — Encounter: Payer: Self-pay | Admitting: Internal Medicine

## 2018-03-20 NOTE — Assessment & Plan Note (Signed)
Blood pressure under good control.  Continue same medication regimen.  Follow pressures.  Follow metabolic panel.   

## 2018-03-20 NOTE — Assessment & Plan Note (Signed)
Physical today 03/17/18.  PAP 01/18/16 - negative with negative HPV.  Mammogram - scheduled for 03/25/18.

## 2018-03-25 ENCOUNTER — Encounter: Payer: Self-pay | Admitting: Radiology

## 2018-03-25 ENCOUNTER — Ambulatory Visit
Admission: RE | Admit: 2018-03-25 | Discharge: 2018-03-25 | Disposition: A | Discharge status: Home / Self Care | Payer: 59 | Source: Ambulatory Visit | Attending: Internal Medicine | Admitting: Internal Medicine

## 2018-03-25 DIAGNOSIS — Z1231 Encounter for screening mammogram for malignant neoplasm of breast: Secondary | ICD-10-CM | POA: Insufficient documentation

## 2018-03-25 IMAGING — MG DIGITAL SCREENING BILATERAL MAMMOGRAM WITH IMPLANTS, CAD AND TOM
8 of 16 series · 8 of 32 positions shown · non-contrast
Comparison: Previous exam(s).

CLINICAL DATA: Screening.

EXAM:
DIGITAL SCREENING BILATERAL MAMMOGRAM WITH IMPLANTS, CAD AND TOMO
The patient has retropectoral implants. Standard and implant
displaced views were performed.

[L MLO]
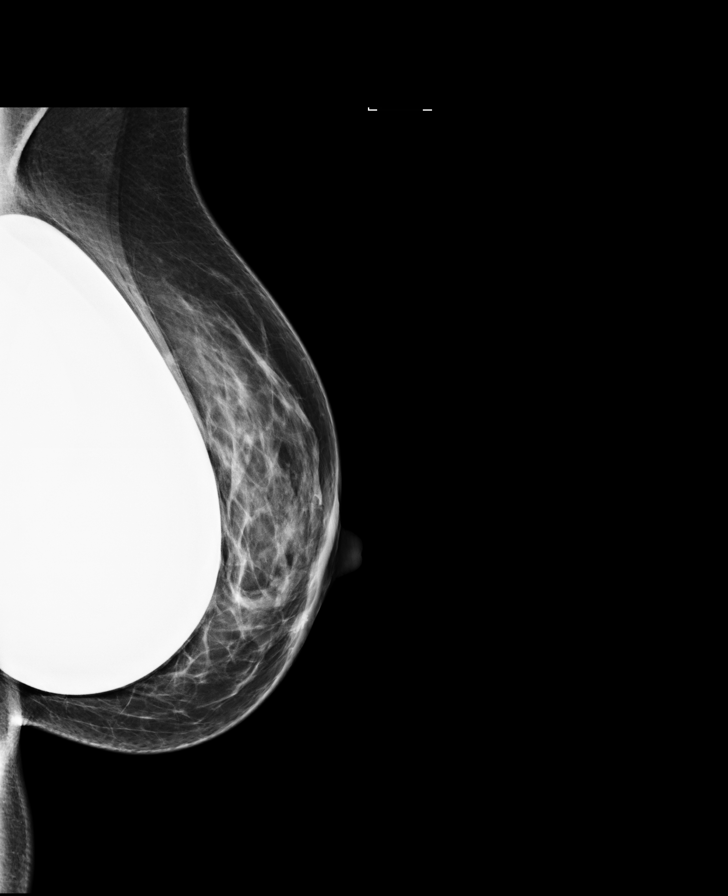

[R MLO (1 of 2)]
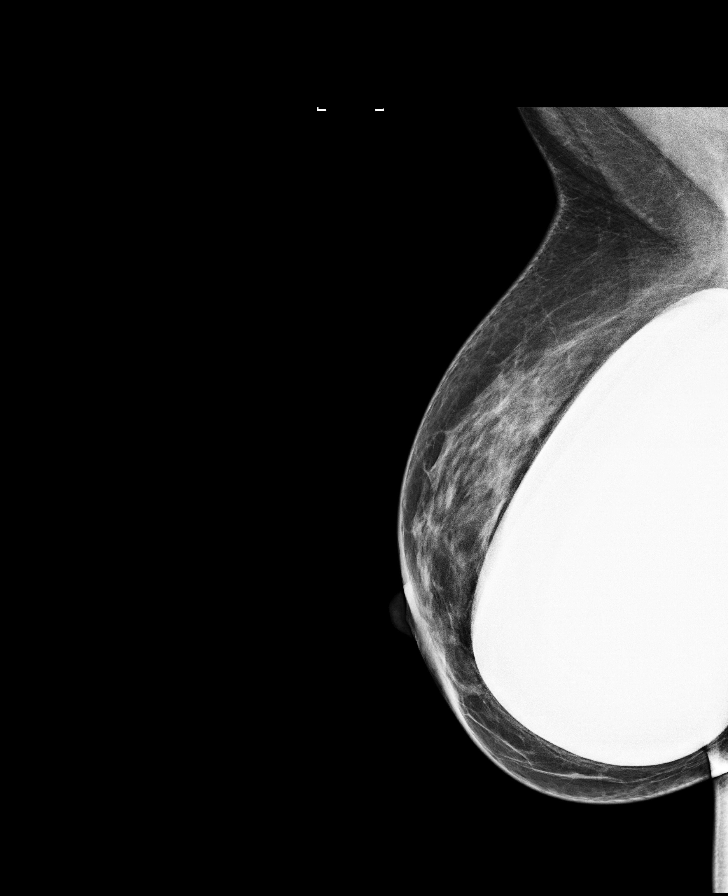

[L CC]
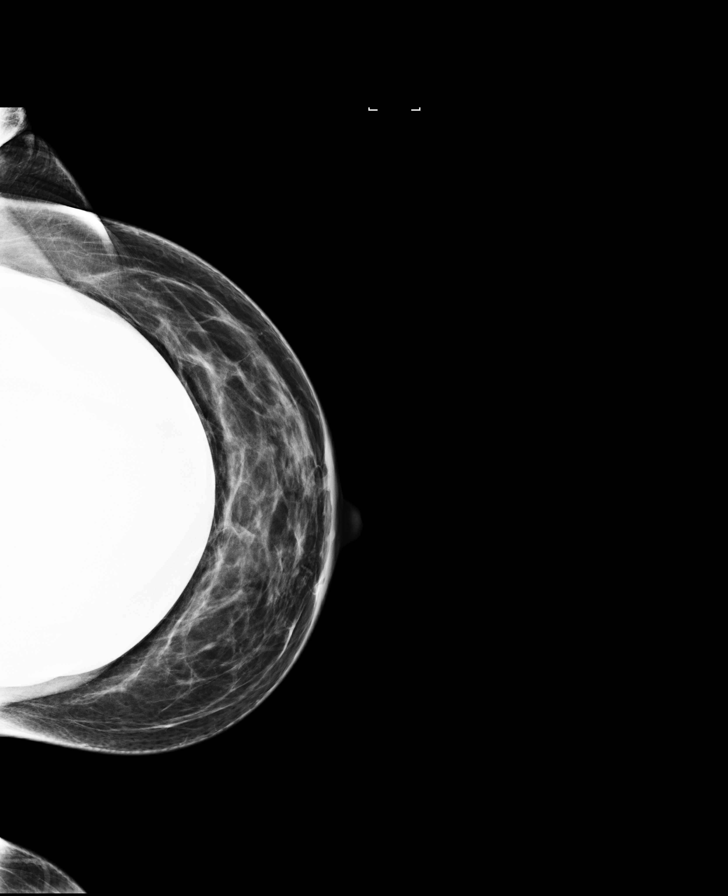

[R CC]
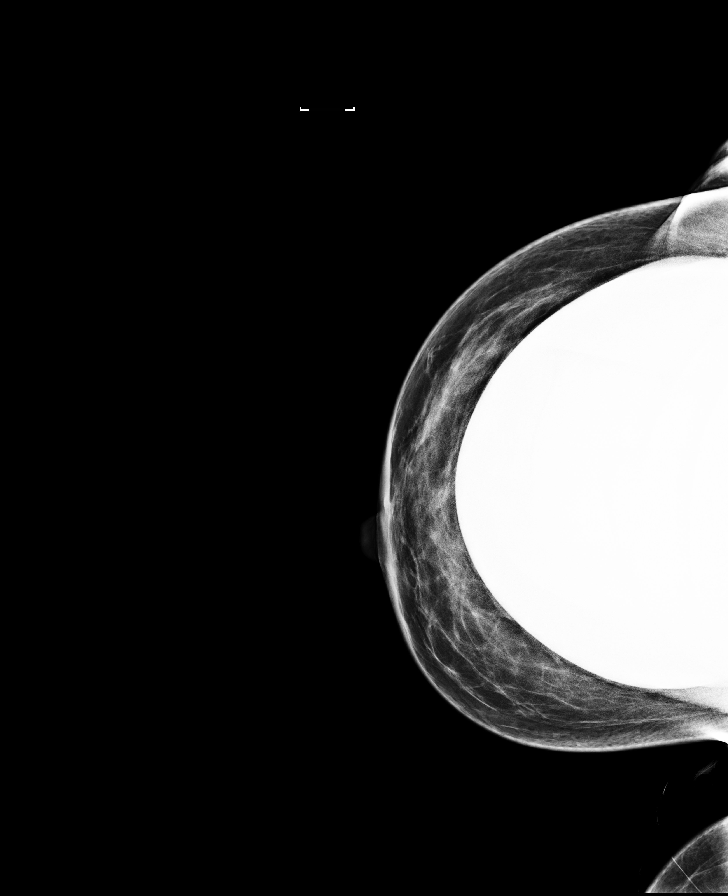

[L CC synth-2D]
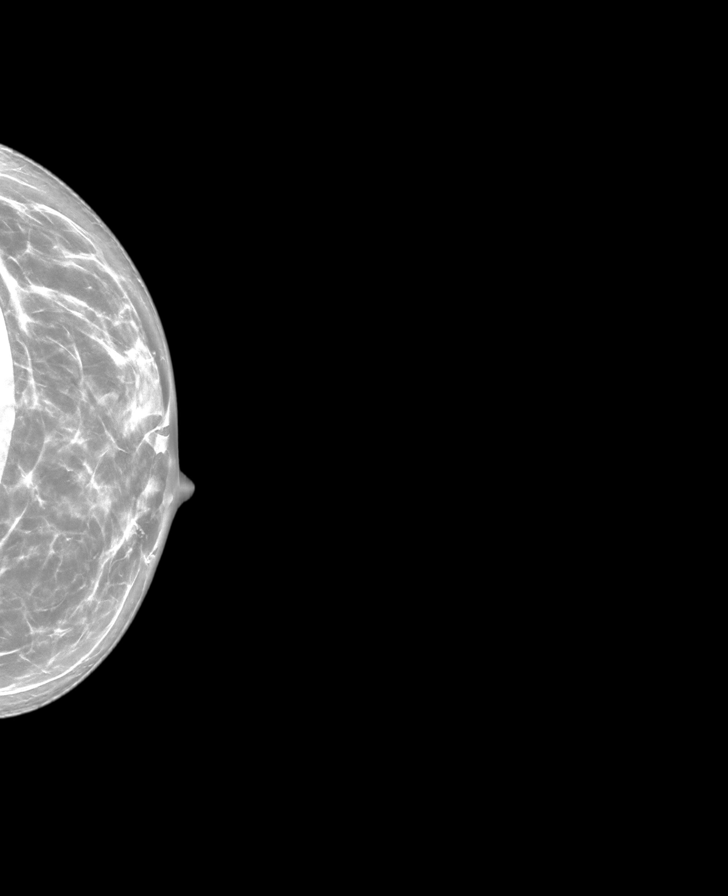

[R MLO synth-2D]
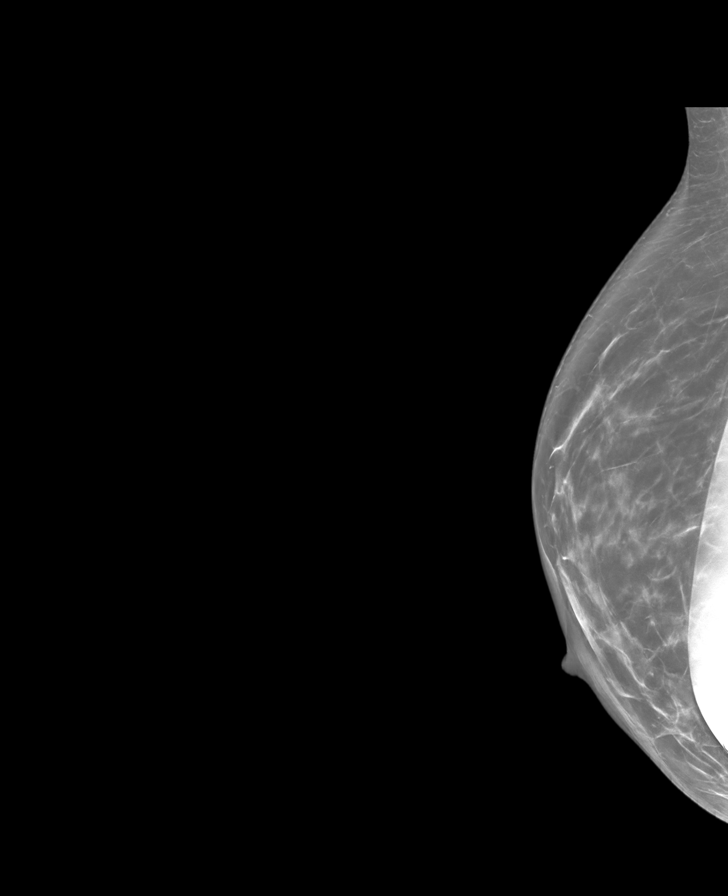

[L MLO synth-2D]
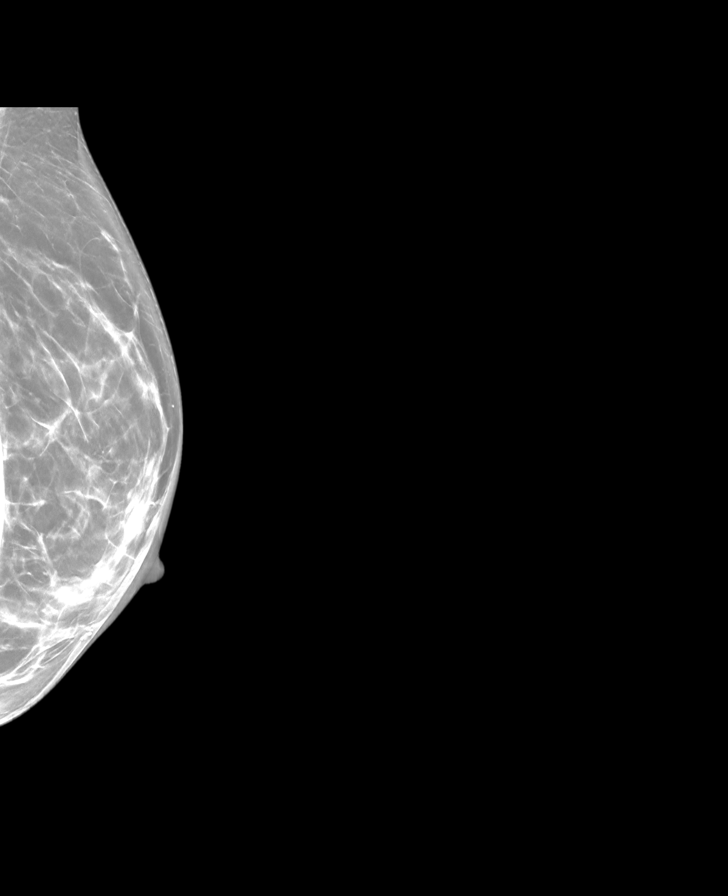

[R MLO (2 of 2)]
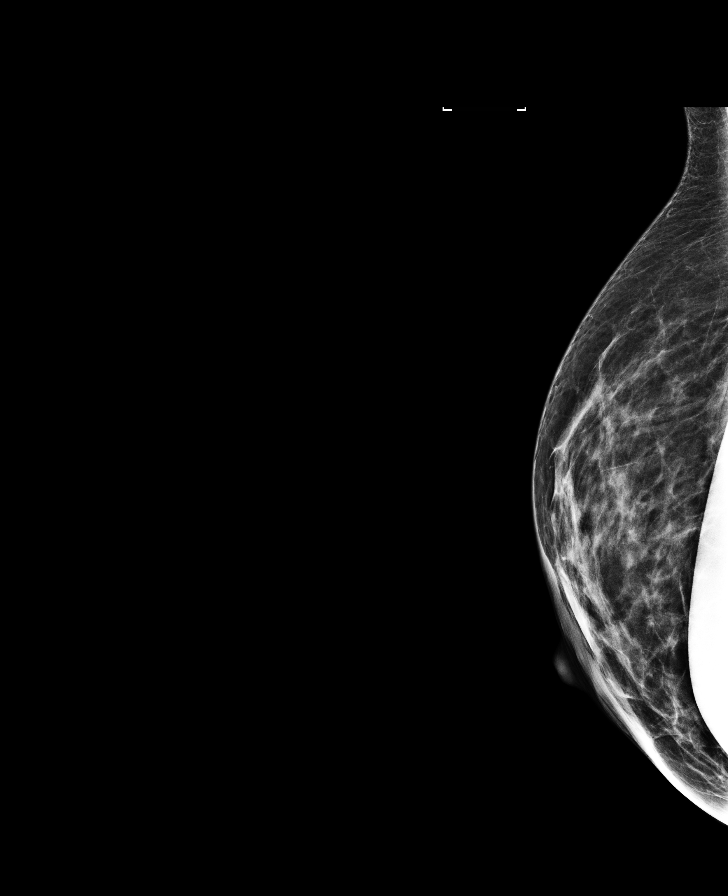

[8 of 32 positions shown; findings below may reference images not displayed]

ACR Breast Density Category c: The breast tissue is heterogeneously
dense, which may obscure small masses.
FINDINGS: There are no findings suspicious for malignancy. Images were
processed with CAD.
IMPRESSION: No mammographic evidence of malignancy. A result letter of this
screening mammogram will be mailed directly to the patient.

RECOMMENDATION:
Screening mammogram in one year. (Code:[5L])

BI-RADS CATEGORY  1:  Negative.

## 2018-04-21 ENCOUNTER — Telehealth: Payer: Self-pay | Admitting: *Deleted

## 2018-04-21 NOTE — Telephone Encounter (Signed)
Copied from [place] [id number]. Topic: Appointment Scheduling - Scheduling Inquiry for Clinic >> Apr 21, 2018  2:46 PM Lennox Solders wrote: Reason for CRM: pt would like to have physical labs in advance. Pt is sch for physical 03-25-2019

## 2018-05-11 ENCOUNTER — Other Ambulatory Visit: Payer: Self-pay | Admitting: Internal Medicine

## 2018-05-11 DIAGNOSIS — I10 Essential (primary) hypertension: Secondary | ICD-10-CM

## 2018-05-11 NOTE — Telephone Encounter (Signed)
Orders placed.

## 2018-05-11 NOTE — Telephone Encounter (Signed)
Please place future lab orders with "expected date" if you are okay with it.

## 2018-05-11 NOTE — Progress Notes (Signed)
Orders placed for pre physical labs.

## 2018-07-14 DIAGNOSIS — M79672 Pain in left foot: Secondary | ICD-10-CM | POA: Diagnosis not present

## 2018-07-14 DIAGNOSIS — M2580 Other specified joint disorders, unspecified joint: Secondary | ICD-10-CM | POA: Diagnosis not present

## 2018-09-28 MED FILL — ATENOLOL 50 MG TABLET: 50 | 90 days supply | Qty: 90 | Fill #0

## 2018-09-28 MED FILL — TRIAMTERENE/HCTZ 37.5/25 TB: 37.5-25 | 90 days supply | Qty: 90 | Fill #0

## 2019-02-08 DIAGNOSIS — L700 Acne vulgaris: Secondary | ICD-10-CM | POA: Diagnosis not present

## 2019-02-08 DIAGNOSIS — L720 Epidermal cyst: Secondary | ICD-10-CM | POA: Diagnosis not present

## 2019-02-08 DIAGNOSIS — D225 Melanocytic nevi of trunk: Secondary | ICD-10-CM | POA: Diagnosis not present

## 2019-02-08 DIAGNOSIS — L578 Other skin changes due to chronic exposure to nonionizing radiation: Secondary | ICD-10-CM | POA: Diagnosis not present

## 2019-02-08 DIAGNOSIS — L905 Scar conditions and fibrosis of skin: Secondary | ICD-10-CM | POA: Diagnosis not present

## 2019-02-08 DIAGNOSIS — L814 Other melanin hyperpigmentation: Secondary | ICD-10-CM | POA: Diagnosis not present

## 2019-02-08 DIAGNOSIS — L821 Other seborrheic keratosis: Secondary | ICD-10-CM | POA: Diagnosis not present

## 2019-02-08 DIAGNOSIS — L853 Xerosis cutis: Secondary | ICD-10-CM | POA: Diagnosis not present

## 2019-02-08 DIAGNOSIS — Z1283 Encounter for screening for malignant neoplasm of skin: Secondary | ICD-10-CM | POA: Diagnosis not present

## 2019-03-01 ENCOUNTER — Other Ambulatory Visit: Payer: Self-pay | Admitting: Internal Medicine

## 2019-03-01 DIAGNOSIS — Z1231 Encounter for screening mammogram for malignant neoplasm of breast: Secondary | ICD-10-CM

## 2019-03-07 DIAGNOSIS — D485 Neoplasm of uncertain behavior of skin: Secondary | ICD-10-CM | POA: Diagnosis not present

## 2019-03-07 DIAGNOSIS — L720 Epidermal cyst: Secondary | ICD-10-CM | POA: Diagnosis not present

## 2019-03-21 ENCOUNTER — Other Ambulatory Visit: Payer: Self-pay

## 2019-03-21 DIAGNOSIS — Z20822 Contact with and (suspected) exposure to covid-19: Secondary | ICD-10-CM

## 2019-03-22 LAB — NOVEL CORONAVIRUS, NAA: SARS-CoV-2, NAA: DETECTED — AB

## 2019-03-25 ENCOUNTER — Encounter: Payer: 59 | Admitting: Internal Medicine

## 2019-03-29 ENCOUNTER — Other Ambulatory Visit: Payer: Self-pay | Admitting: Internal Medicine

## 2019-04-06 ENCOUNTER — Encounter: Payer: 59 | Admitting: Internal Medicine

## 2019-04-12 ENCOUNTER — Ambulatory Visit
Admission: RE | Admit: 2019-04-12 | Discharge: 2019-04-12 | Disposition: A | Discharge status: Home / Self Care | Payer: 59 | Source: Ambulatory Visit | Attending: Internal Medicine | Admitting: Internal Medicine

## 2019-04-12 DIAGNOSIS — Z1231 Encounter for screening mammogram for malignant neoplasm of breast: Secondary | ICD-10-CM | POA: Insufficient documentation

## 2019-04-12 IMAGING — MG DIGITAL SCREENING BREAST BILAT IMPLANT W/ TOMO W/ CAD
8 of 12 series · 8 of 28 positions shown · non-contrast
Comparison: Previous exam(s).

CLINICAL DATA: Screening.

EXAM:
DIGITAL SCREENING BILATERAL MAMMOGRAM WITH IMPLANTS, CAD AND TOMO
The patient has bilateral subpectoral silicone implants. Standard
and implant displaced views were performed.

[L CC]
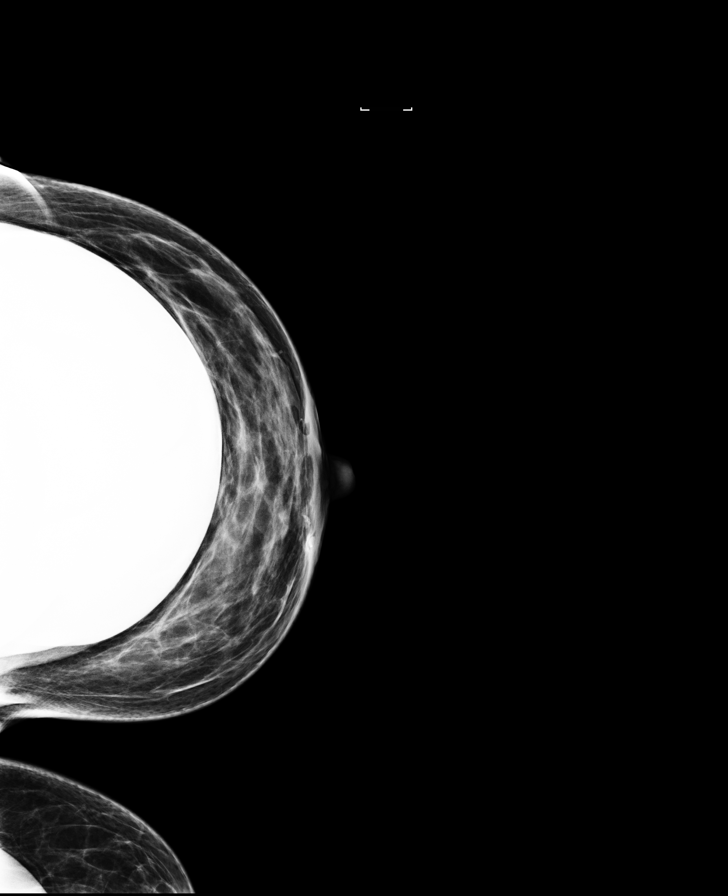

[R CC]
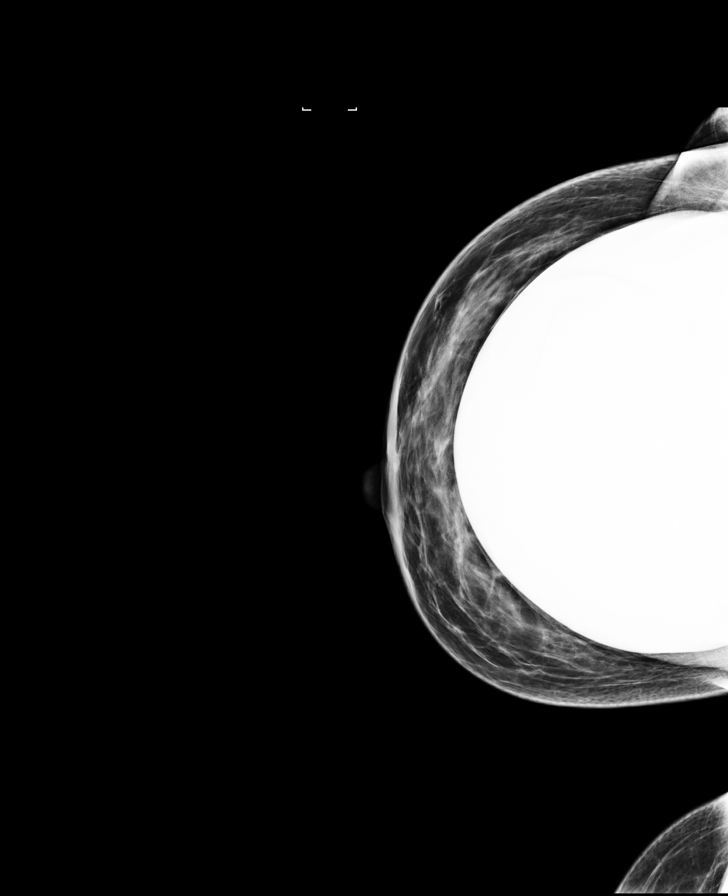

[R MLO]
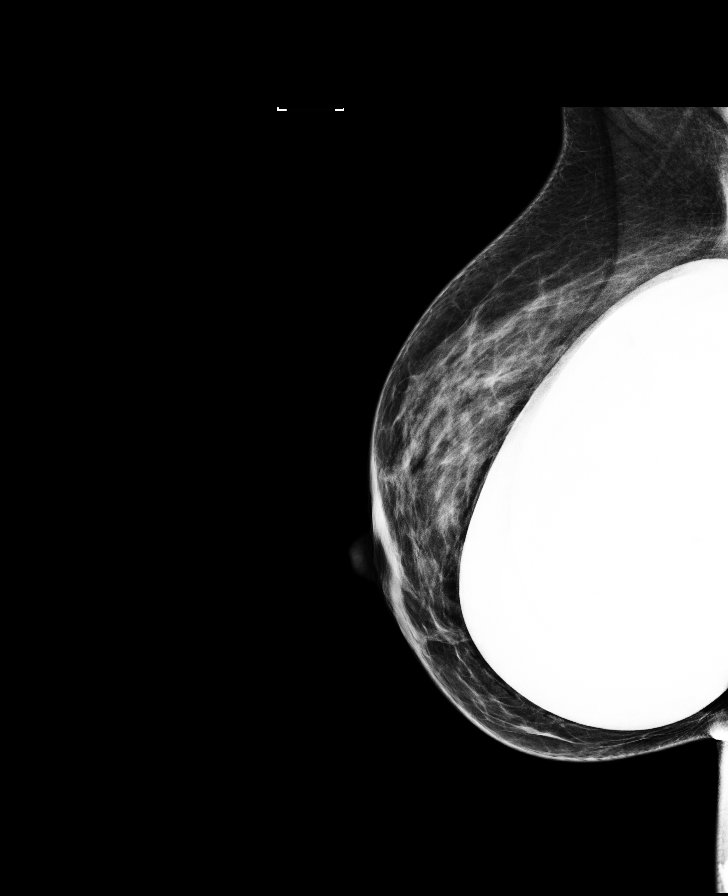

[L MLO]
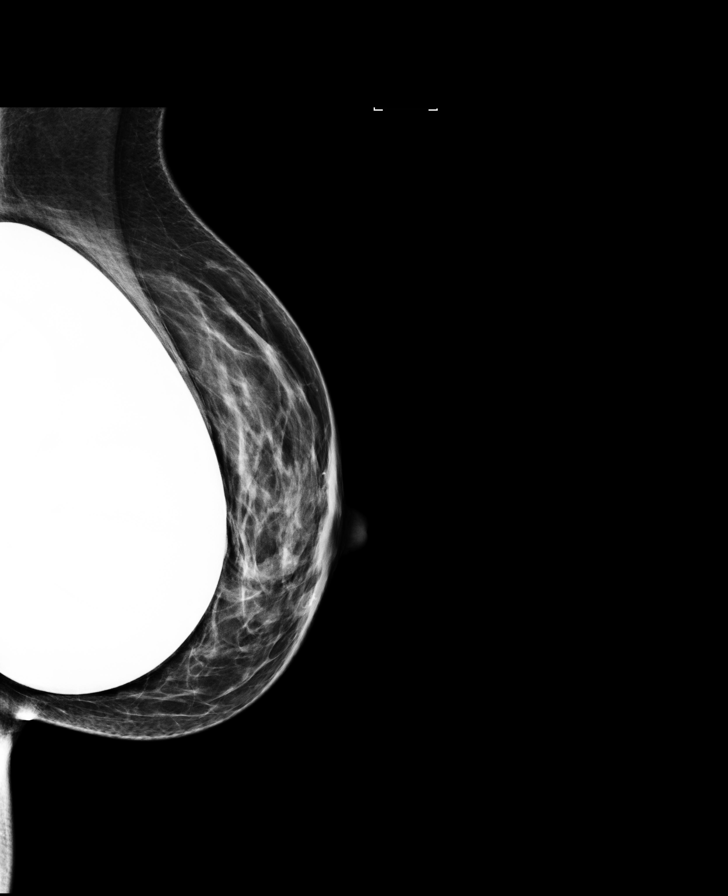

[R MLO synth-2D]
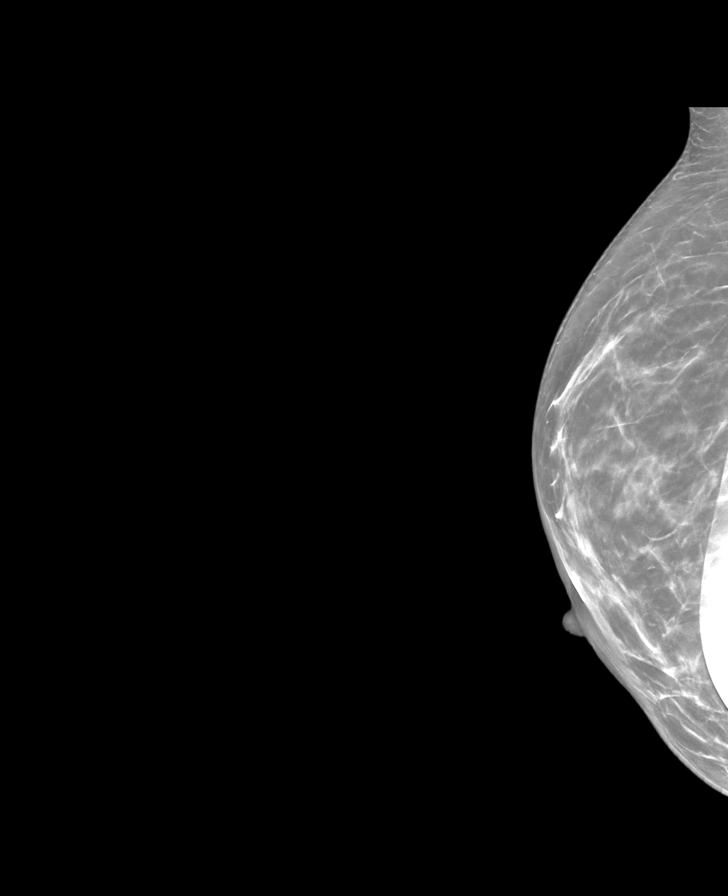

[R CC synth-2D]
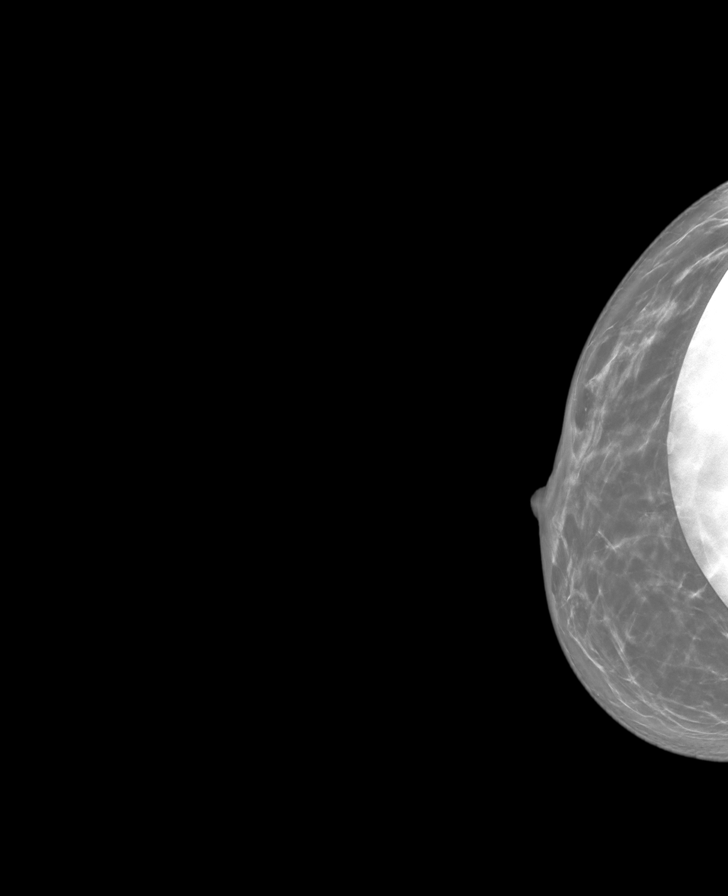

[L MLO synth-2D]
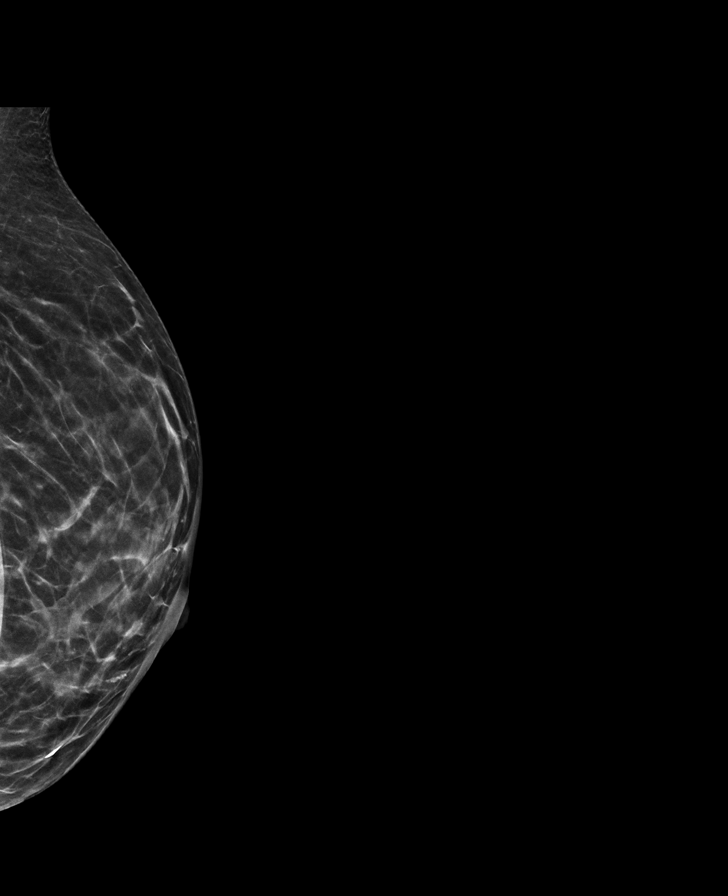

[L CC synth-2D]
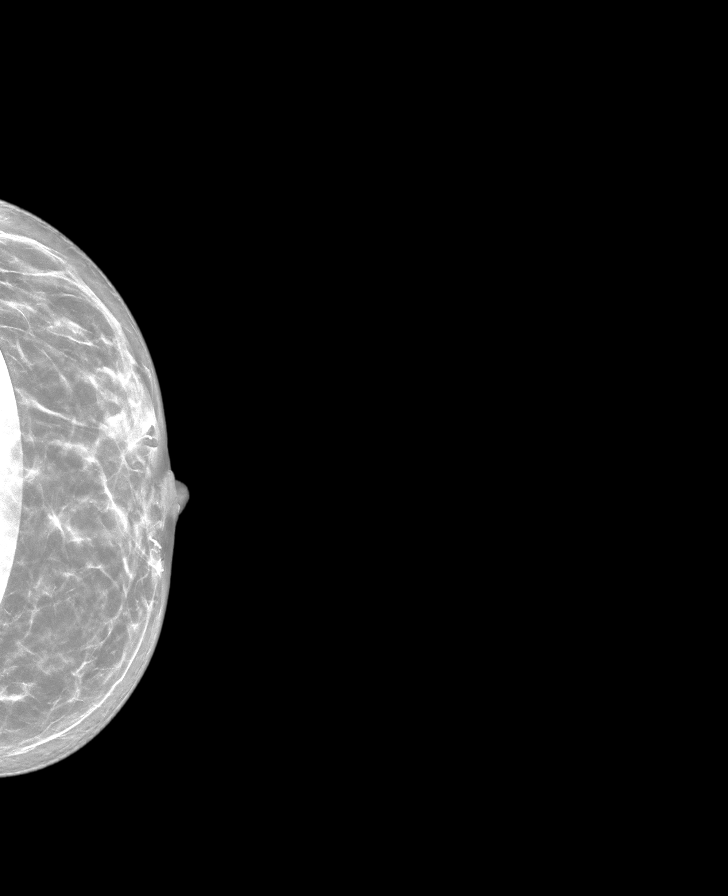

[8 of 28 positions shown; findings below may reference images not displayed]

ACR Breast Density Category b: There are scattered areas of
fibroglandular density.
FINDINGS: There are no findings suspicious for malignancy. Images were
processed with CAD.
IMPRESSION: No mammographic evidence of malignancy. A result letter of this
screening mammogram will be mailed directly to the patient.

RECOMMENDATION:
Screening mammogram in one year. (Code:[CM])

BI-RADS CATEGORY  1:  Negative.

## 2019-06-30 ENCOUNTER — Telehealth: Payer: Self-pay | Admitting: Internal Medicine

## 2019-06-30 DIAGNOSIS — Z13220 Encounter for screening for lipoid disorders: Secondary | ICD-10-CM

## 2019-06-30 DIAGNOSIS — I10 Essential (primary) hypertension: Secondary | ICD-10-CM

## 2019-07-01 MED ORDER — ATENOLOL 50 MG PO TABS: 50.0000 mg | ORAL_TABLET | Freq: Every day | ORAL | 0 refills | Status: DC

## 2019-07-01 MED ORDER — TRIAMTERENE-HCTZ 37.5-25 MG PO TABS: 1.0000 | ORAL_TABLET | Freq: Every day | ORAL | 0 refills | Status: DC

## 2019-07-01 NOTE — Telephone Encounter (Signed)
Signed lab orders.

## 2019-07-01 NOTE — Telephone Encounter (Signed)
Please make sure both medications are refilled, Thanks

## 2019-07-01 NOTE — Telephone Encounter (Signed)
I have ordered her labs and sending in rx for her medication. Just make sure there is nothing else you would like to add.

## 2019-07-01 NOTE — Addendum Note (Signed)
Addended by: Lars Masson on: 07/01/2019 03:36 PM   Modules accepted: Orders

## 2019-07-01 NOTE — Telephone Encounter (Signed)
Patient needs her BP medication refilled. Patient has an up coming appointment in March. Please refill and contact patient.

## 2019-07-04 ENCOUNTER — Other Ambulatory Visit (INDEPENDENT_AMBULATORY_CARE_PROVIDER_SITE_OTHER): Payer: 59

## 2019-07-04 ENCOUNTER — Other Ambulatory Visit: Payer: Self-pay

## 2019-07-04 DIAGNOSIS — I10 Essential (primary) hypertension: Secondary | ICD-10-CM | POA: Diagnosis not present

## 2019-07-04 DIAGNOSIS — Z13220 Encounter for screening for lipoid disorders: Secondary | ICD-10-CM

## 2019-07-04 LAB — CBC WITH DIFFERENTIAL/PLATELET
Basophils Absolute: 0 10*3/uL (ref 0.0–0.1)
Basophils Relative: 0.5 % (ref 0.0–3.0)
Eosinophils Absolute: 0.1 10*3/uL (ref 0.0–0.7)
Eosinophils Relative: 1.5 % (ref 0.0–5.0)
HCT: 43.1 % (ref 36.0–46.0)
Hemoglobin: 14.4 g/dL (ref 12.0–15.0)
Lymphocytes Relative: 27.3 % (ref 12.0–46.0)
Lymphs Abs: 1.6 10*3/uL (ref 0.7–4.0)
MCHC: 33.3 g/dL (ref 30.0–36.0)
MCV: 92.6 fl (ref 78.0–100.0)
Monocytes Absolute: 0.6 10*3/uL (ref 0.1–1.0)
Monocytes Relative: 10.6 % (ref 3.0–12.0)
Neutro Abs: 3.6 10*3/uL (ref 1.4–7.7)
Neutrophils Relative %: 60.1 % (ref 43.0–77.0)
Platelets: 192 10*3/uL (ref 150.0–400.0)
RBC: 4.66 Mil/uL (ref 3.87–5.11)
RDW: 13.1 % (ref 11.5–15.5)
WBC: 6 10*3/uL (ref 4.0–10.5)

## 2019-07-04 LAB — TSH: TSH: 1.17 u[IU]/mL (ref 0.35–4.50)

## 2019-07-05 ENCOUNTER — Encounter: Payer: Self-pay | Admitting: Internal Medicine

## 2019-07-05 LAB — BASIC METABOLIC PANEL
BUN: 18 mg/dL (ref 6–23)
CO2: 25 mEq/L (ref 19–32)
Calcium: 9.4 mg/dL (ref 8.4–10.5)
Chloride: 107 mEq/L (ref 96–112)
Creatinine, Ser: 0.85 mg/dL (ref 0.40–1.20)
GFR: 72.82 mL/min (ref >60.00)
Glucose, Bld: 101 mg/dL — ABNORMAL HIGH (ref 70–99)
Potassium: 4 mEq/L (ref 3.5–5.1)
Sodium: 141 mEq/L (ref 135–145)

## 2019-07-05 LAB — LIPID PANEL
Cholesterol: 146 mg/dL (ref 0–200)
HDL: 58.2 mg/dL (ref >39.00)
LDL Cholesterol: 71 mg/dL (ref 0–99)
NonHDL: 87.57
Total CHOL/HDL Ratio: 3
Triglycerides: 81 mg/dL (ref 0.0–149.0)
VLDL: 16.2 mg/dL (ref 0.0–40.0)

## 2019-07-05 LAB — HEPATIC FUNCTION PANEL
ALT: 12 U/L (ref 0–35)
AST: 15 U/L (ref 0–37)
Albumin: 4.6 g/dL (ref 3.5–5.2)
Alkaline Phosphatase: 52 U/L (ref 39–117)
Bilirubin, Direct: 0.1 mg/dL (ref 0.0–0.3)
Total Bilirubin: 0.6 mg/dL (ref 0.2–1.2)
Total Protein: 6.9 g/dL (ref 6.0–8.3)

## 2019-08-05 ENCOUNTER — Other Ambulatory Visit: Payer: Self-pay

## 2019-08-05 MED ORDER — ATENOLOL 50 MG PO TABS: 50.0000 mg | ORAL_TABLET | Freq: Every day | ORAL | 0 refills | Status: DC

## 2019-08-05 MED ORDER — TRIAMTERENE-HCTZ 37.5-25 MG PO TABS: 1.0000 | ORAL_TABLET | Freq: Every day | ORAL | 0 refills | Status: DC

## 2019-08-09 ENCOUNTER — Ambulatory Visit (INDEPENDENT_AMBULATORY_CARE_PROVIDER_SITE_OTHER): Payer: 59 | Admitting: Internal Medicine

## 2019-08-09 ENCOUNTER — Other Ambulatory Visit (HOSPITAL_COMMUNITY)
Admission: RE | Admit: 2019-08-09 | Discharge: 2019-08-09 | Disposition: A | Discharge status: Home / Self Care | Payer: 59 | Source: Ambulatory Visit | Attending: Internal Medicine | Admitting: Internal Medicine

## 2019-08-09 ENCOUNTER — Other Ambulatory Visit: Payer: Self-pay

## 2019-08-09 VITALS — BP 116/70 | HR 63 | Temp 98.2°F | Resp 16 | Ht 68.0 in | Wt 158.2 lb

## 2019-08-09 DIAGNOSIS — Z124 Encounter for screening for malignant neoplasm of cervix: Secondary | ICD-10-CM | POA: Insufficient documentation

## 2019-08-09 DIAGNOSIS — I10 Essential (primary) hypertension: Secondary | ICD-10-CM

## 2019-08-09 DIAGNOSIS — E0789 Other specified disorders of thyroid: Secondary | ICD-10-CM

## 2019-08-09 DIAGNOSIS — Z0000 Encounter for general adult medical examination without abnormal findings: Secondary | ICD-10-CM

## 2019-08-09 NOTE — Progress Notes (Signed)
Patient ID: Jennifer Olsen, female   DOB: [birth year], 44 y.o.   MRN: [id number]   Subjective:    Patient ID: Jennifer Olsen, female    DOB: [birth year], 43 y.o.   MRN: [id number]  HPI This visit occurred during the SARS-CoV-2 public health emergency.  Safety protocols were in place, including screening questions prior to the visit, additional usage of staff PPE, and extensive cleaning of exam room while observing appropriate contact time as indicated for disinfecting solutions.  Patient here for her physical exam. She reports she is doing well.  Feels good.  Stays active.  No chest pain or sob with increased activity or exertion.  No acid reflux reported.  No abdominal pain or bowel issues reported.  No problems swallowing.  Exercises regularly.    Past Medical History:  Diagnosis Date  . Hypertension    s/p renal artery doppler which was normal  . Persistent headaches    Past Surgical History:  Procedure Laterality Date  . AUGMENTATION MAMMAPLASTY Bilateral 05/2015   saline  . FOOT SURGERY     bilateral, bunions, Dr. Elvina Mattes  . NOVASURE ABLATION  2010   Dr. Amalia Hailey  . WRIST SURGERY  2011   left wrist, fracture, Dr. Mauri Pole   Family History  Problem Relation Age of Onset  . Aneurysm Mother   . Heart disease Mother   . Breast cancer Neg Hx    Social History   Socioeconomic History  . Marital status: Married    Spouse name: Not on file  . Number of children: Not on file  . Years of education: Not on file  . Highest education level: Not on file  Occupational History  . Not on file  Tobacco Use  . Smoking status: Never Smoker  . Smokeless tobacco: Never Used  Substance and Sexual Activity  . Alcohol use: No  . Drug use: No  . Sexual activity: Not on file  Other Topics Concern  . Not on file  Social History Narrative   Lives in [place] with 3 children 16, 12, 5YO.  Dog in home.   Recently married.   Works - [employer]   Diet - regular   Exercise - walking  20-73min daily   Social Determinants of Health   Financial Resource Strain:   . Difficulty of Paying Living Expenses:   Food Insecurity:   . Worried About Charity fundraiser in the Last Year:   . Arboriculturist in the Last Year:   Transportation Needs:   . Film/video editor (Medical):   Marland Kitchen Lack of Transportation (Non-Medical):   Physical Activity:   . Days of Exercise per Week:   . Minutes of Exercise per Session:   Stress:   . Feeling of Stress :   Social Connections:   . Frequency of Communication with Friends and Family:   . Frequency of Social Gatherings with Friends and Family:   . Attends Religious Services:   . Active Member of Clubs or Organizations:   . Attends Archivist Meetings:   Marland Kitchen Marital Status:     Outpatient Encounter Medications as of 08/09/2019  Medication Sig  . hydrocortisone 2.5 % cream Apply to affected area as directed  . Multiple Vitamin (MULTIVITAMIN) tablet Take 1 tablet by mouth daily.  . [DISCONTINUED] atenolol (TENORMIN) 50 MG tablet Take 1 tablet (50 mg total) by mouth daily.  . [DISCONTINUED] triamterene-hydrochlorothiazide (MAXZIDE-25) 37.5-25 MG tablet Take 1 tablet by mouth daily.  No facility-administered encounter medications on file as of 08/09/2019.    Review of Systems  Constitutional: Negative for appetite change and unexpected weight change.  HENT: Negative for congestion and sinus pressure.   Eyes: Negative for pain and visual disturbance.  Respiratory: Negative for cough, chest tightness and shortness of breath.   Cardiovascular: Negative for chest pain, palpitations and leg swelling.  Gastrointestinal: Negative for abdominal pain, diarrhea, nausea and vomiting.  Genitourinary: Negative for difficulty urinating and dysuria.  Musculoskeletal: Negative for joint swelling and myalgias.  Skin: Negative for color change and rash.  Neurological: Negative for dizziness, light-headedness and headaches.  Hematological:  Negative for adenopathy. Does not bruise/bleed easily.  Psychiatric/Behavioral: Negative for agitation and dysphoric mood.       Objective:    Physical Exam Constitutional:      General: She is not in acute distress.    Appearance: Normal appearance. She is well-developed.  HENT:     Head: Normocephalic and atraumatic.     Right Ear: External ear normal.     Left Ear: External ear normal.  Eyes:     General: No scleral icterus.       Right eye: No discharge.        Left eye: No discharge.     Conjunctiva/sclera: Conjunctivae normal.  Neck:     Thyroid: No thyromegaly.  Cardiovascular:     Rate and Rhythm: Normal rate and regular rhythm.  Pulmonary:     Effort: No tachypnea, accessory muscle usage or respiratory distress.     Breath sounds: Normal breath sounds. No decreased breath sounds or wheezing.  Chest:     Breasts:        Right: No inverted nipple, mass, nipple discharge or tenderness (no axillary adenopathy).        Left: No inverted nipple, mass, nipple discharge or tenderness (no axilarry adenopathy).  Abdominal:     General: Bowel sounds are normal.     Palpations: Abdomen is soft.     Tenderness: There is no abdominal tenderness.  Genitourinary:    Comments: Normal external genitalia.  Vaginal vault without lesions.  Cervix identified.  Pap smear performed.  Could not appreciate any adnexal masses or tenderness.   Musculoskeletal:        General: No swelling or tenderness.     Cervical back: Neck supple. No tenderness.  Lymphadenopathy:     Cervical: No cervical adenopathy.  Skin:    Findings: No erythema or rash.  Neurological:     Mental Status: She is alert and oriented to person, place, and time.  Psychiatric:        Mood and Affect: Mood normal.        Behavior: Behavior normal.     BP 116/70   Pulse 63   Temp 98.2 F (36.8 C)   Resp 16   Ht 5\' 8"  (1.727 m)   Wt 158 lb 3.2 oz (71.8 kg)   SpO2 99%   BMI 24.05 kg/m  Wt Readings from Last 3  Encounters:  08/09/19 158 lb 3.2 oz (71.8 kg)  03/17/18 167 lb 3.2 oz (75.8 kg)  02/02/17 160 lb 8 oz (72.8 kg)     Lab Results  Component Value Date   WBC 6.0 07/04/2019   HGB 14.4 07/04/2019   HCT 43.1 07/04/2019   PLT 192.0 07/04/2019   GLUCOSE 101 (H) 07/04/2019   CHOL 146 07/04/2019   TRIG 81.0 07/04/2019   HDL 58.20 07/04/2019   LDLCALC  71 07/04/2019   ALT 12 07/04/2019   AST 15 07/04/2019   NA 141 07/04/2019   K 4.0 07/04/2019   CL 107 07/04/2019   CREATININE 0.85 07/04/2019   BUN 18 07/04/2019   CO2 25 07/04/2019   TSH 1.17 07/04/2019   MICROALBUR 1.7 01/18/2016    MM 3D SCREEN BREAST W/IMPLANT BILATERAL  Result Date: 04/12/2019 CLINICAL DATA:  Screening. EXAM: DIGITAL SCREENING BILATERAL MAMMOGRAM WITH IMPLANTS, CAD AND TOMO The patient has bilateral subpectoral silicone implants. Standard and implant displaced views were performed. COMPARISON:  Previous exam(s). ACR Breast Density Category b: There are scattered areas of fibroglandular density. FINDINGS: There are no findings suspicious for malignancy. Images were processed with CAD. IMPRESSION: No mammographic evidence of malignancy. A result letter of this screening mammogram will be mailed directly to the patient. RECOMMENDATION: Screening mammogram in one year. (Code:SM-B-01Y) BI-RADS CATEGORY  1:  Negative. Electronically Signed   By: Everlean Alstrom M.D.   On: 04/12/2019 10:25       Assessment & Plan:   Problem List Items Addressed This Visit    Hypertension    Blood pressure doing well.  Continue atenolol and triam/hctz.  Follow pressures.  Follow metabolic panel.        Routine general medical examination at a health care facility    Physical today 08/09/19.  PAP 08/09/19.  Check routine labs.       Thyroid fullness    Increased thyroid fullness on exam.  Check thyroid ultrasound.        Relevant Orders   US THYROID (Completed)    Other Visit Diagnoses    Cervical cancer screening    -  Primary     Relevant Orders   Cytology - PAP( [place]) (Completed)       Einar Pheasant, MD

## 2019-08-09 NOTE — Assessment & Plan Note (Addendum)
Physical today 08/09/19.  PAP 08/09/19.  Check routine labs.

## 2019-08-10 LAB — CYTOLOGY - PAP
Comment: NEGATIVE
Diagnosis: NEGATIVE
High risk HPV: NEGATIVE

## 2019-08-11 ENCOUNTER — Encounter: Payer: Self-pay | Admitting: Internal Medicine

## 2019-08-15 ENCOUNTER — Ambulatory Visit
Admission: RE | Admit: 2019-08-15 | Discharge: 2019-08-15 | Disposition: A | Discharge status: Home / Self Care | Payer: 59 | Source: Ambulatory Visit | Attending: Internal Medicine | Admitting: Internal Medicine

## 2019-08-15 ENCOUNTER — Other Ambulatory Visit: Payer: Self-pay

## 2019-08-15 ENCOUNTER — Encounter: Payer: Self-pay | Admitting: Internal Medicine

## 2019-08-15 ENCOUNTER — Other Ambulatory Visit: Payer: Self-pay | Admitting: Internal Medicine

## 2019-08-15 DIAGNOSIS — E0789 Other specified disorders of thyroid: Secondary | ICD-10-CM | POA: Diagnosis not present

## 2019-08-15 DIAGNOSIS — E041 Nontoxic single thyroid nodule: Secondary | ICD-10-CM

## 2019-08-15 DIAGNOSIS — E042 Nontoxic multinodular goiter: Secondary | ICD-10-CM | POA: Diagnosis not present

## 2019-08-15 DIAGNOSIS — E049 Nontoxic goiter, unspecified: Secondary | ICD-10-CM

## 2019-08-15 IMAGING — US US THYROID
1 series · 13 of 25 positions shown · non-contrast
Comparison: None.

CLINICAL DATA: Palpable abnormality. Thyroid fullness on physical
examination.

EXAM:
THYROID ULTRASOUND
TECHNIQUE: Ultrasound examination of the thyroid gland and adjacent soft
tissues was performed.

[Series 1: us thyroid · 0.07mm/px · 13 of 46 slices shown]
[im 1/46]
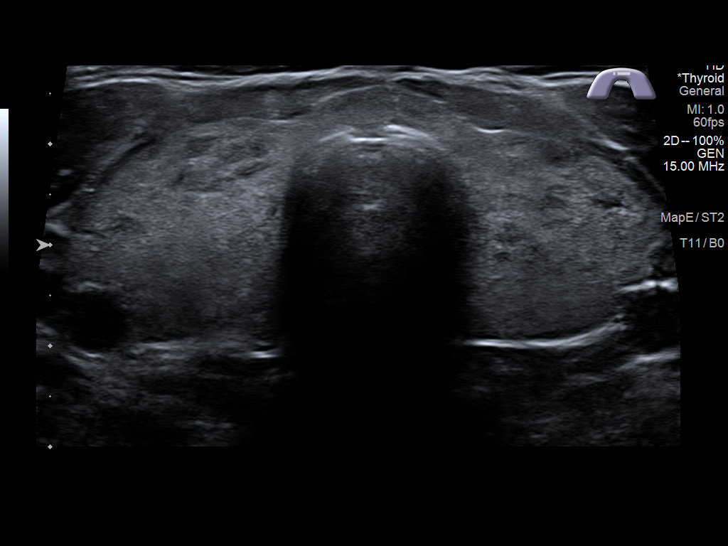
[im 4/46]
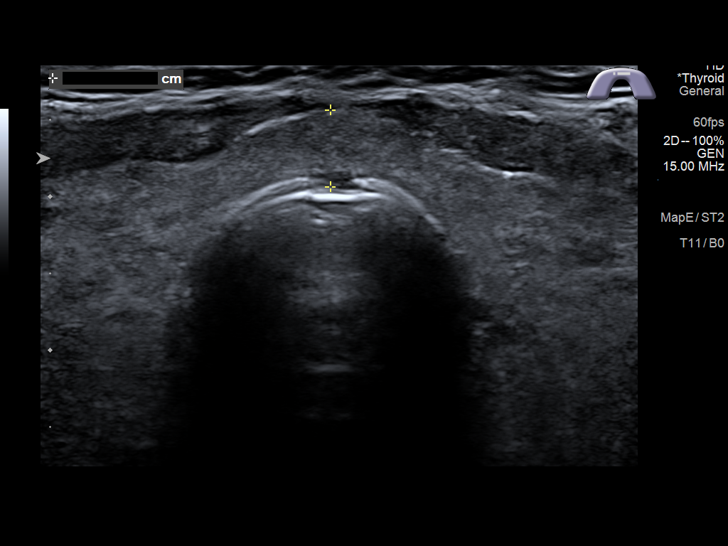
[im 8/46]
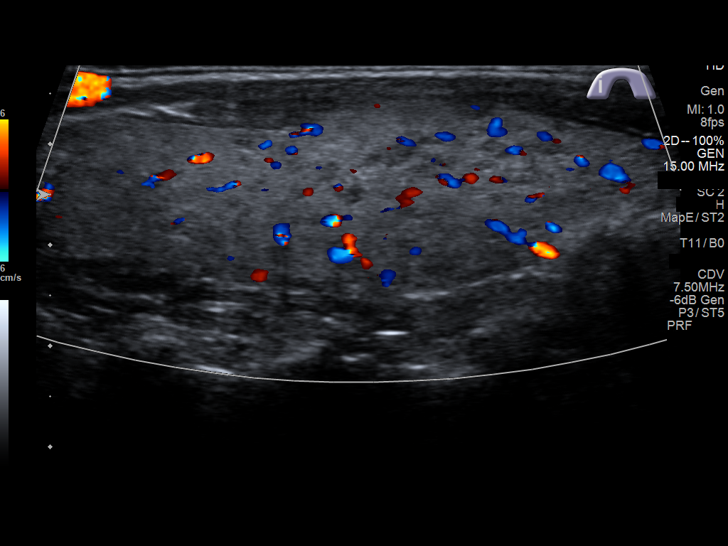
[im 12/46]
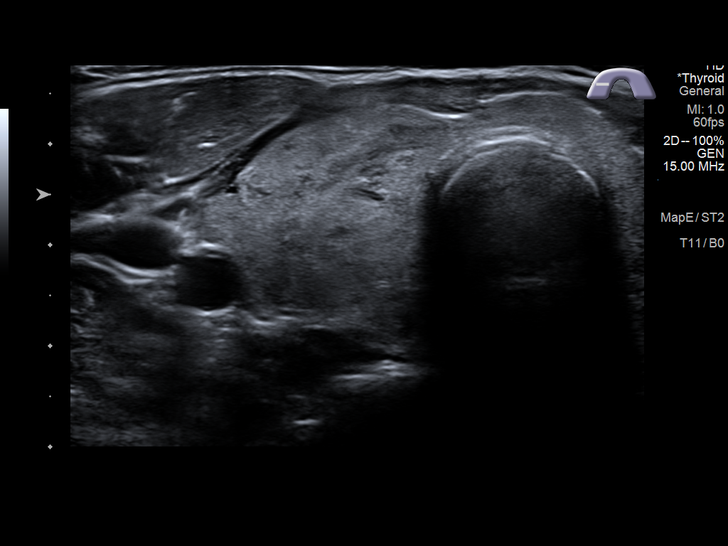
[im 16/46]
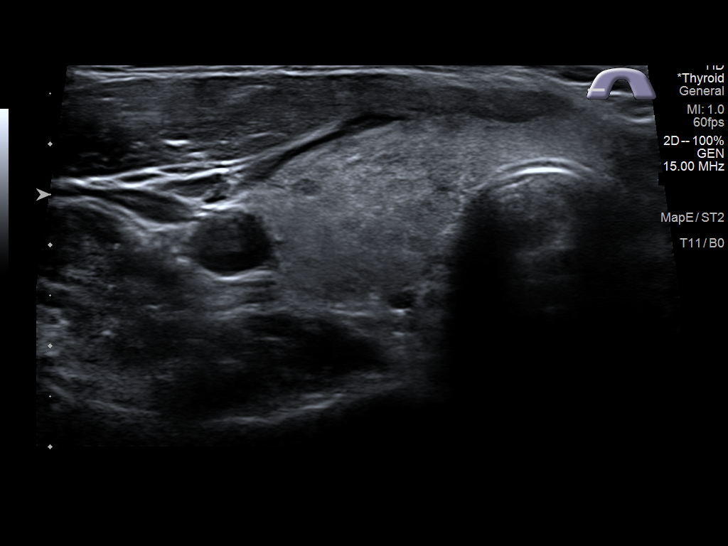
[im 19/46]
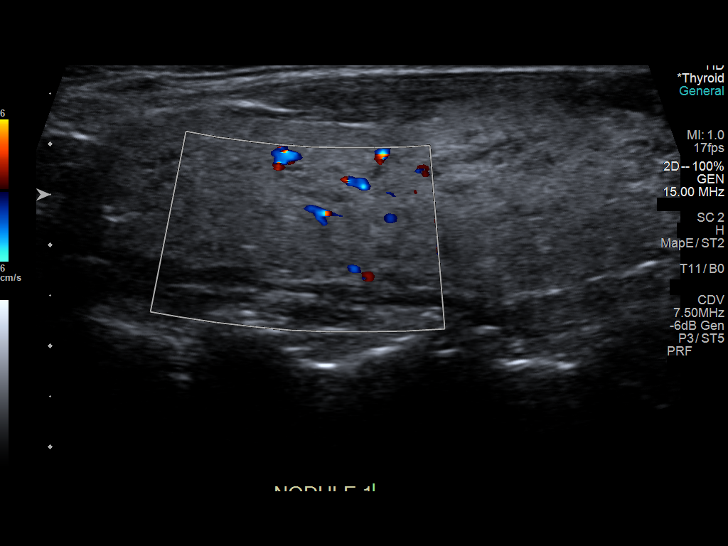
[im 23/46]
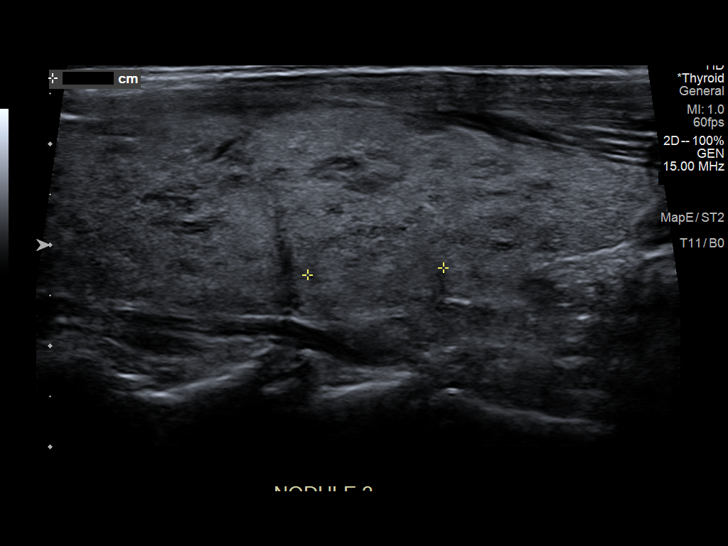
[im 27/46]
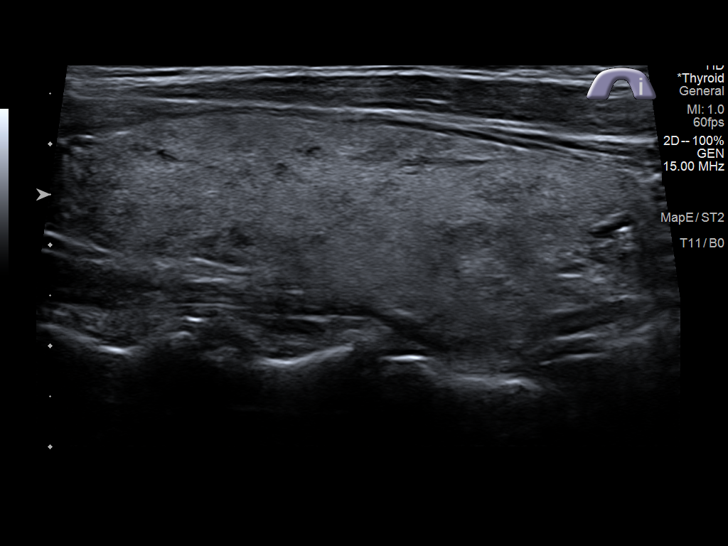
[im 31/46]
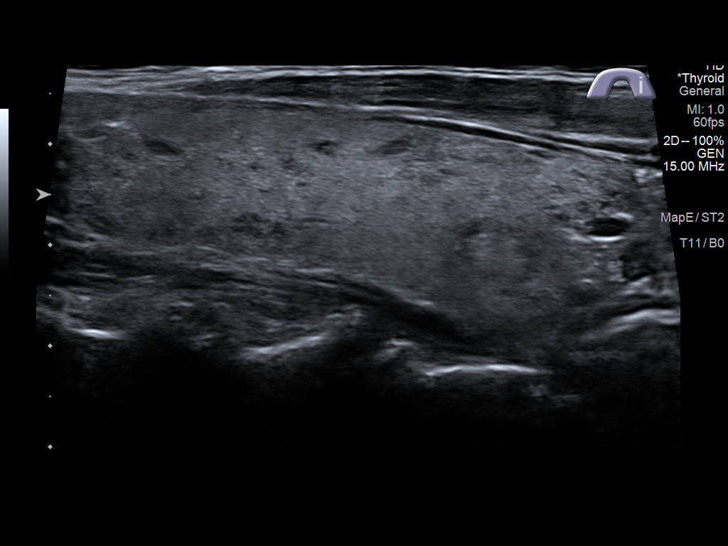
[im 34/46]
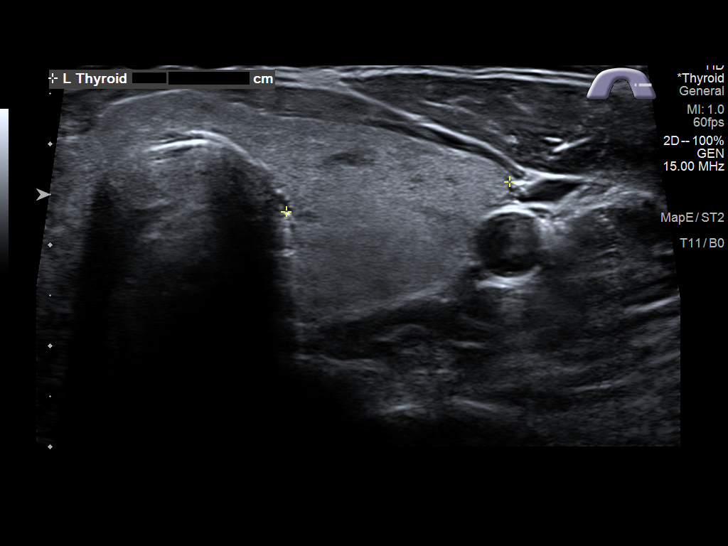
[im 38/46]
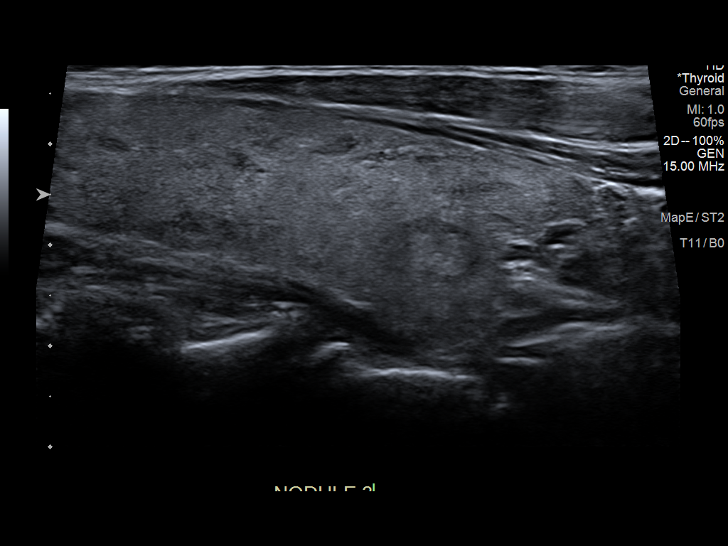
[im 42/46]
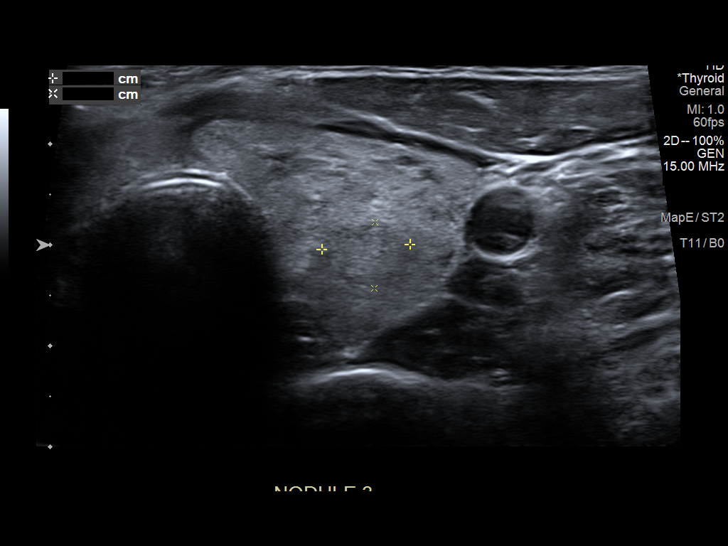
[im 46/46]
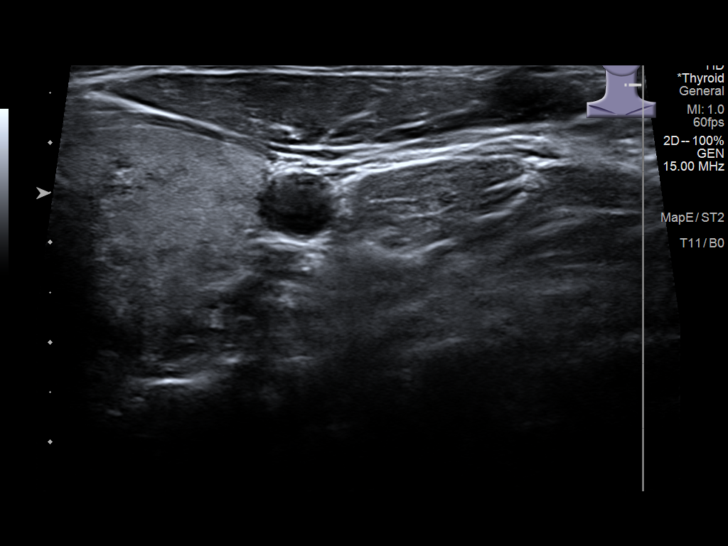

[13 of 25 positions shown; findings below may reference images not displayed]

FINDINGS: Parenchymal Echotexture: Moderately heterogenous

Isthmus: Normal in size measuring 0.5 cm in diameter

Right lobe: Enlarged measuring 5.9 x 2.0 x 2.2 cm

Left lobe: Enlarged measuring 5.4 x 1.8 x 2.2 cm

_________________________________________________________

Estimated total number of nodules >/= 1 cm: 2

Number of spongiform nodules >/=  2 cm not described below (TR1): 0

Number of mixed cystic and solid nodules >/= 1.5 cm not described
below (TR2): 0

_________________________________________________________

There is an approximately 0.7 x 0.7 x 0.7 cm isoechoic ill-defined
nodule/pseudonodule within the mid aspect the right lobe of the
thyroid (labeled 1), which does not meet criteria to recommend
percutaneous sampling or continued dedicated follow-up.

There is an approximately 1.4 x 1.2 x 0.9 cm ill-defined isoechoic
nodule/pseudonodule within the mid aspect the right lobe of the
thyroid (labeled 2), which does not meet imaging criteria to
recommend percutaneous sampling or continued dedicated follow-up.

There is an approximately 1.0 x 0.9 x 0.7 cm ill-defined isoechoic
nodule/pseudonodule within the mid aspect the left lobe of the
thyroid (labeled 3), which does not meet imaging criteria to
recommend percutaneous sampling or continued dedicated follow-up.
IMPRESSION: 1. Thyromegaly with findings suggestive of multinodular goiter.
2. None of the discretely measured thyroid nodules/pseudo nodules
meet imaging criteria to recommend percutaneous sampling or
continued dedicated follow-up.

The above is in keeping with the ACR TI-RADS recommendations - [HOSPITAL] [18];[DATE].

## 2019-08-17 ENCOUNTER — Other Ambulatory Visit: Payer: Self-pay | Admitting: Internal Medicine

## 2019-08-17 MED ORDER — ATENOLOL 50 MG PO TABS: 50.0000 mg | ORAL_TABLET | Freq: Every day | ORAL | 3 refills | Status: DC

## 2019-08-17 MED ORDER — TRIAMTERENE-HCTZ 37.5-25 MG PO TABS: 1.0000 | ORAL_TABLET | Freq: Every day | ORAL | 3 refills | Status: DC

## 2019-08-17 NOTE — Telephone Encounter (Signed)
rx sent in for triam/hctz #90 with 3 refills and atenolol #90 with 3 refills.

## 2019-08-20 ENCOUNTER — Encounter: Payer: Self-pay | Admitting: Internal Medicine

## 2019-08-20 NOTE — Assessment & Plan Note (Signed)
Blood pressure doing well.  Continue atenolol and triam/hctz.  Follow pressures.  Follow metabolic panel.

## 2019-08-20 NOTE — Assessment & Plan Note (Signed)
Increased thyroid fullness on exam.  Check thyroid ultrasound.

## 2019-10-21 NOTE — Telephone Encounter (Signed)
Order placed for endocrinology referral.  

## 2019-10-21 NOTE — Telephone Encounter (Signed)
See message below. Pt would like to see Dr Gabriel Carina

## 2019-10-21 NOTE — Telephone Encounter (Signed)
Pt wants to see A. Melissa Solum @ [facility]. Please send referral.

## 2019-12-20 DIAGNOSIS — E042 Nontoxic multinodular goiter: Secondary | ICD-10-CM | POA: Diagnosis not present

## 2020-02-21 DIAGNOSIS — E042 Nontoxic multinodular goiter: Secondary | ICD-10-CM | POA: Diagnosis not present

## 2020-02-28 DIAGNOSIS — E042 Nontoxic multinodular goiter: Secondary | ICD-10-CM | POA: Diagnosis not present

## 2020-03-21 ENCOUNTER — Other Ambulatory Visit: Payer: Self-pay | Admitting: Internal Medicine

## 2020-03-21 ENCOUNTER — Telehealth: Payer: Self-pay | Admitting: Gastroenterology

## 2020-03-21 DIAGNOSIS — K649 Unspecified hemorrhoids: Secondary | ICD-10-CM

## 2020-03-21 NOTE — Telephone Encounter (Signed)
Left vm for pt to return my call to schedule sooner appt.

## 2020-03-21 NOTE — Telephone Encounter (Signed)
Please put her in whenever she can come in. Just overbook me. Thanks.

## 2020-03-21 NOTE — Progress Notes (Signed)
Order placed for GI referral.   

## 2020-03-21 NOTE — Telephone Encounter (Signed)
Patients husband Dr. Lucky Cowboy called and states pt has been having problems with hemorrhoids for the past couple weeks and is requesting an appt with Dr. Allen Norris for her. Patient was put in 1:15PM slot on 12.6.21, but Dr. Lucky Cowboy wants to know if she can be "fit it" sooner. Please advise on scheduling. Dr. Lucky Cowboy was notified to also have a referral sent over for her.

## 2020-03-22 NOTE — Telephone Encounter (Signed)
Pt returned my call and was scheduled for an office appt on Tuesday, Nov 16th.

## 2020-03-26 NOTE — Progress Notes (Signed)
Gastroenterology Consultation  Referring Provider:     Einar Pheasant, MD Primary Care Physician:  Einar Pheasant, MD Primary Gastroenterologist:  Dr. Allen Norris     Reason for Consultation:     Hemorrhoidal problems        HPI:   Jennifer Olsen is a 44 y.o. y/o female referred for consultation & management of hemorrhoidal problems by Dr. Einar Pheasant, MD.  This patient comes in today with a report of problems with her hemorrhoids. The patient reports that she has a lot of itching and scratches her anal region whenever her itching becomes intolerable.  The patient has had some blood when she states that she scratches or takes at the area.  The patient has tried multiple things including baby wipes and diaper rash cream without any resolution of her symptoms.  There is no report of any family history of colon cancer colon polyps.  The patient also denies any fevers chills nausea vomiting black stools or bloody stools.  She reports the symptoms have been going on for approximately 2 months.  Past Medical History:  Diagnosis Date  . Hypertension    s/p renal artery doppler which was normal  . Persistent headaches     Past Surgical History:  Procedure Laterality Date  . AUGMENTATION MAMMAPLASTY Bilateral 05/2015   saline  . FOOT SURGERY     bilateral, bunions, Dr. Elvina Mattes  . NOVASURE ABLATION  2010   Dr. Amalia Hailey  . WRIST SURGERY  2011   left wrist, fracture, Dr. Mauri Pole    Prior to Admission medications   Medication Sig Start Date End Date Taking? Authorizing Provider  atenolol (TENORMIN) 50 MG tablet Take 1 tablet (50 mg total) by mouth daily. 08/17/19   Einar Pheasant, MD  hydrocortisone 2.5 % cream Apply to affected area as directed 03/17/18   Einar Pheasant, MD  Multiple Vitamin (MULTIVITAMIN) tablet Take 1 tablet by mouth daily.    [provider]  triamterene-hydrochlorothiazide (MAXZIDE-25) 37.5-25 MG tablet Take 1 tablet by mouth daily. 08/17/19   Einar Pheasant, MD     Family History  Problem Relation Age of Onset  . Aneurysm Mother   . Heart disease Mother   . Breast cancer Neg Hx      Social History   Tobacco Use  . Smoking status: Never Smoker  . Smokeless tobacco: Never Used  Substance Use Topics  . Alcohol use: No  . Drug use: No    Allergies as of 03/27/2020  . (No Known Allergies)    Review of Systems:    All systems reviewed and negative except where noted in HPI.   Physical Exam:  There were no vitals taken for this visit. No LMP recorded. Patient has had an ablation. General:   Alert,  Well-developed, well-nourished, pleasant and cooperative in NAD Head:  Normocephalic and atraumatic. Eyes:  Sclera clear, no icterus.   Conjunctiva pink. Ears:  Normal auditory acuity. Lungs:  Respirations even and unlabored.  Clear throughout to auscultation.   No wheezes, crackles, or rhonchi. No acute distress. Heart:  Regular rate and rhythm; no murmurs, clicks, rubs, or gallops. Rectal:  External rectal exam shows chafing around the rectum and multiple spots without any appreciable hemorrhoids externally but skin tag seen..  Extremities:  No clubbing or edema.  No cyanosis. Neurologic:  Alert and oriented x3;  grossly normal neurologically. Skin:  Intact without significant lesions or rashes.  No jaundice. Lymph Nodes:  No significant cervical adenopathy. Psych:  Alert and cooperative. Normal mood and affect.  Imaging Studies: No results found.  Assessment and Plan:   Jennifer Olsen is a 44 y.o. y/o female who comes in with pruritus ani.  The patient has been told to increase fiber in her diet and to avoid pasty soft stools which are hard to clean up.  The patient states that she takes a herbal laxative in addition to a cleansing pill.  The patient has been told to try and avoid anything that to make her stool softer and harder to clean the area around her anus.  The patient has also been explained that her hemorrhoids or not the issue  but the chafing around her anus is where she is having the burning and itching originating from.  The patient will be started on hydrocortisone suppositories to try to decrease the inflammation in that area.  The patient will also start using fragrances free and alcohol free wipes to clean the anal area to avoid continued itching and burning.  She has also been told to try and keep the area dry.  If the patient does not improve she has been told to contact me whereupon she may need to undergo visualization of the area with a sigmoidoscopy versus colonoscopy.  The patient has been explained the plan and agrees with it.    Lucilla Lame, MD. Marval Regal    Note: This dictation was prepared with Dragon dictation along with smaller phrase technology. Any transcriptional errors that result from this process are unintentional.

## 2020-03-27 ENCOUNTER — Encounter: Payer: Self-pay | Admitting: Gastroenterology

## 2020-03-27 ENCOUNTER — Ambulatory Visit: Payer: 59 | Admitting: Gastroenterology

## 2020-03-27 ENCOUNTER — Other Ambulatory Visit: Payer: Self-pay | Admitting: Gastroenterology

## 2020-03-27 ENCOUNTER — Other Ambulatory Visit: Payer: Self-pay

## 2020-03-27 VITALS — BP 137/88 | HR 67 | Temp 98.2°F | Ht 68.0 in | Wt 161.1 lb

## 2020-03-27 DIAGNOSIS — L290 Pruritus ani: Secondary | ICD-10-CM | POA: Diagnosis not present

## 2020-03-27 MED ORDER — HYDROCORTISONE ACETATE 25 MG RE SUPP: 25.0000 mg | Freq: Two times a day (BID) | RECTAL | 0 refills | Status: DC

## 2020-03-30 ENCOUNTER — Other Ambulatory Visit: Payer: Self-pay | Admitting: Nurse Practitioner

## 2020-04-16 ENCOUNTER — Ambulatory Visit: Payer: 59 | Admitting: Gastroenterology

## 2020-04-23 ENCOUNTER — Other Ambulatory Visit: Payer: Self-pay | Admitting: Unknown Physician Specialty

## 2020-04-23 DIAGNOSIS — J309 Allergic rhinitis, unspecified: Secondary | ICD-10-CM | POA: Diagnosis not present

## 2020-04-23 DIAGNOSIS — R070 Pain in throat: Secondary | ICD-10-CM | POA: Diagnosis not present

## 2020-06-01 ENCOUNTER — Other Ambulatory Visit: Payer: Self-pay | Admitting: Internal Medicine

## 2020-06-01 DIAGNOSIS — Z1231 Encounter for screening mammogram for malignant neoplasm of breast: Secondary | ICD-10-CM

## 2020-06-29 ENCOUNTER — Ambulatory Visit
Admission: RE | Admit: 2020-06-29 | Discharge: 2020-06-29 | Disposition: A | Discharge status: Home / Self Care | Payer: 59 | Source: Ambulatory Visit | Attending: Internal Medicine | Admitting: Internal Medicine

## 2020-06-29 ENCOUNTER — Other Ambulatory Visit: Payer: Self-pay

## 2020-06-29 DIAGNOSIS — Z1231 Encounter for screening mammogram for malignant neoplasm of breast: Secondary | ICD-10-CM | POA: Insufficient documentation

## 2020-06-29 IMAGING — MG DIGITAL SCREENING BREAST BILAT IMPLANT W/ TOMO W/ CAD
8 of 12 series · 8 of 28 positions shown · non-contrast
Comparison: Previous exam(s).

CLINICAL DATA: Screening.

EXAM:
DIGITAL SCREENING BILATERAL MAMMOGRAM WITH IMPLANTS, CAD AND
TOMOSYNTHESIS
TECHNIQUE: Bilateral screening digital craniocaudal and mediolateral oblique
mammograms were obtained. Bilateral screening digital breast
tomosynthesis was performed. The images were evaluated with
computer-aided detection. Standard and/or implant displaced views
were performed.

[R CC]
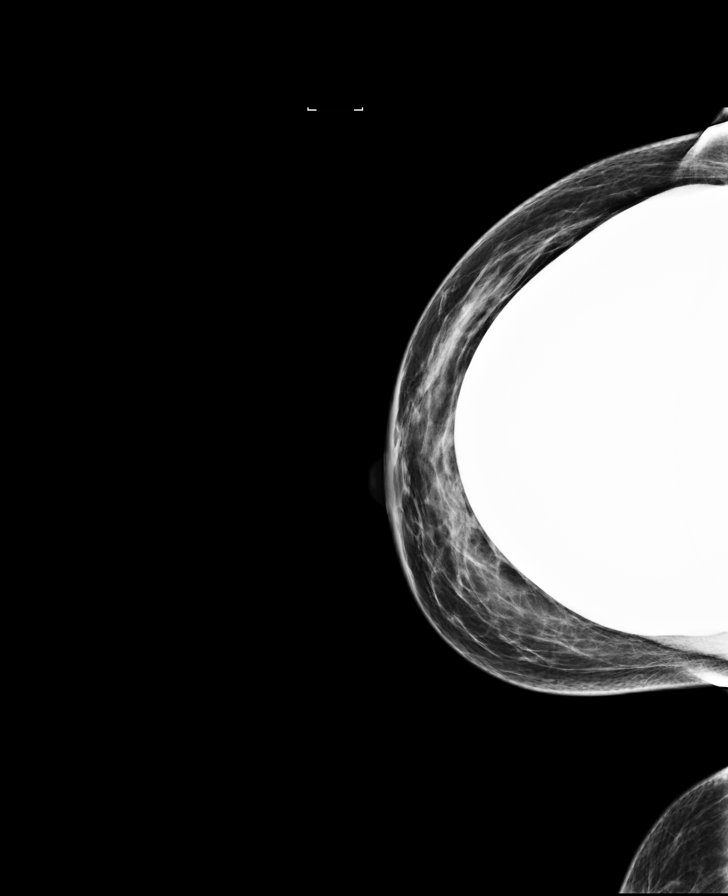

[L MLO]
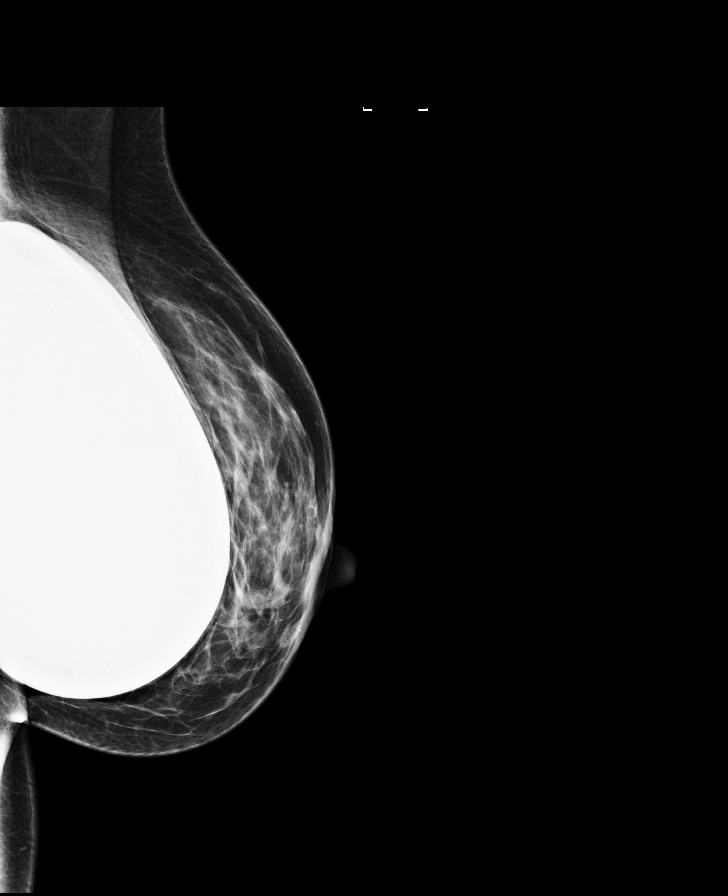

[R MLO]
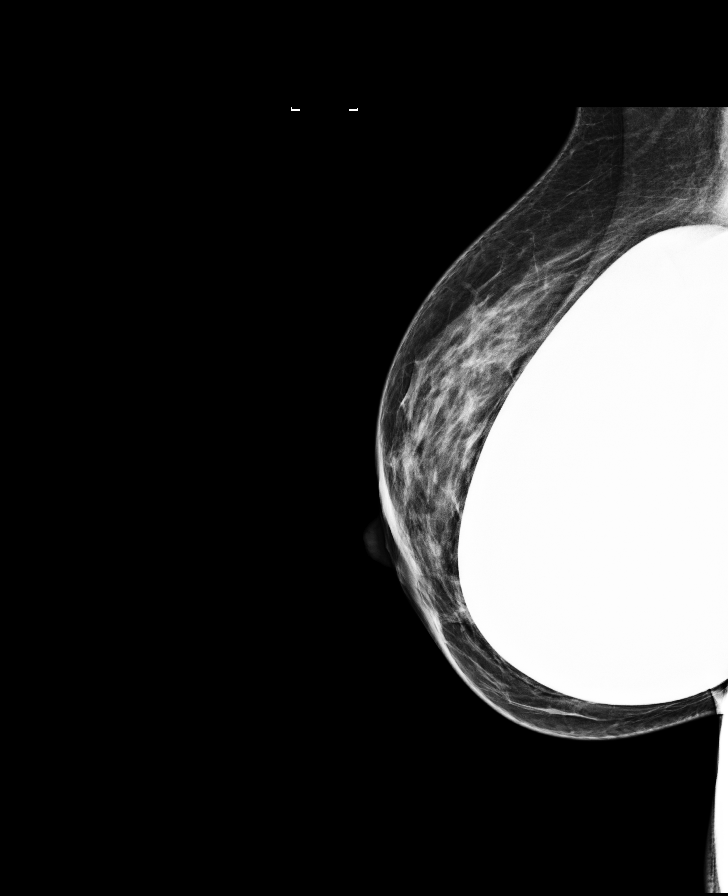

[L CC]
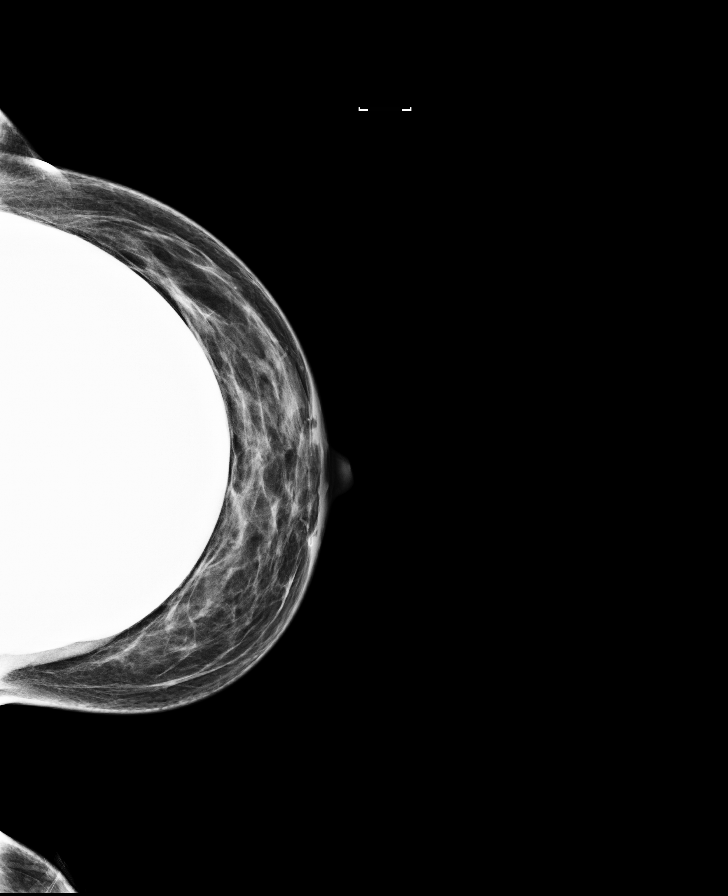

[L CC synth-2D]
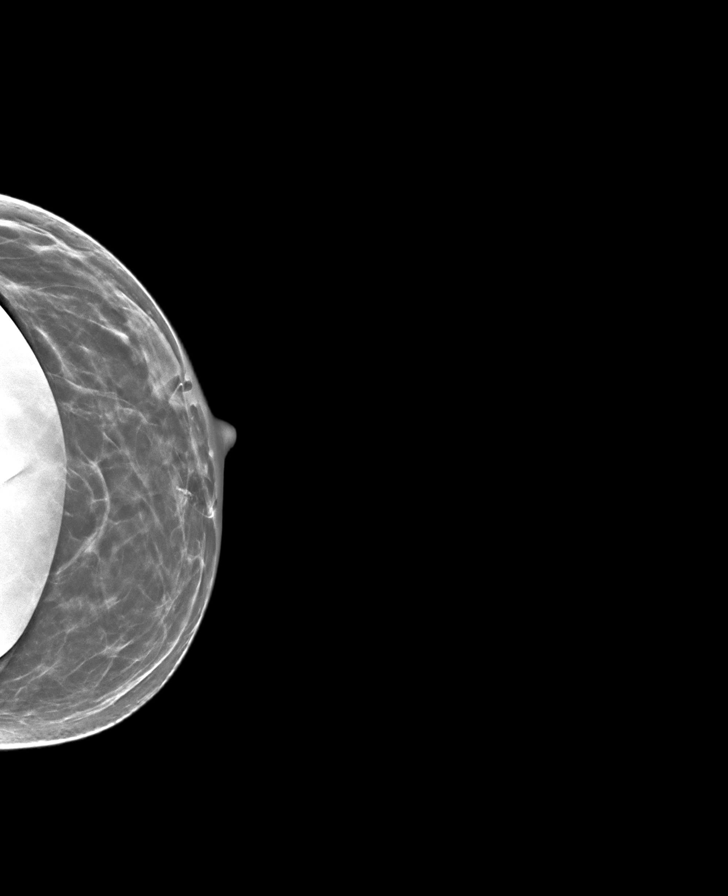

[R MLO synth-2D]
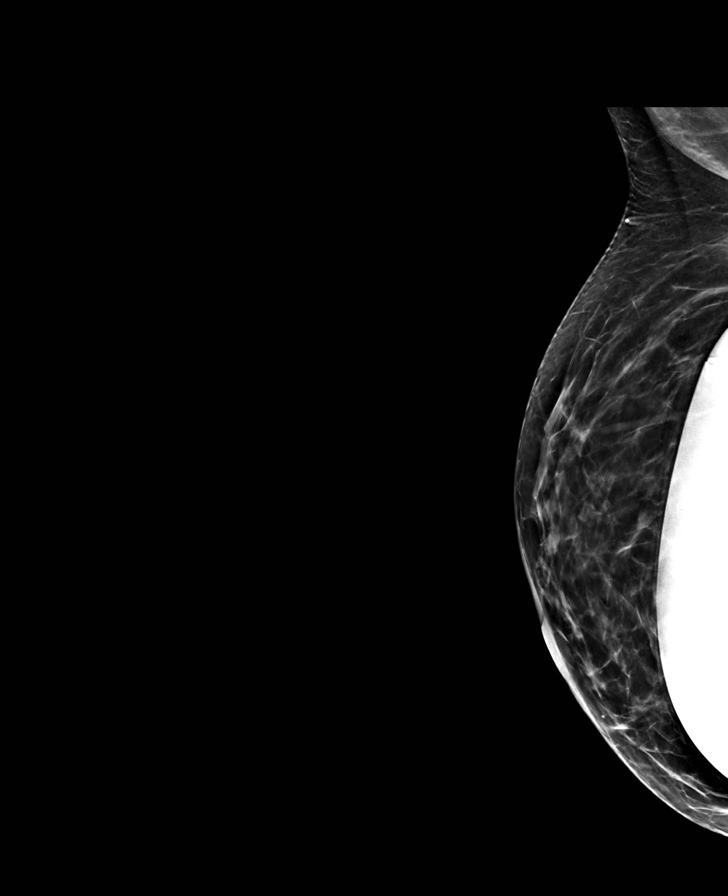

[L MLO synth-2D]
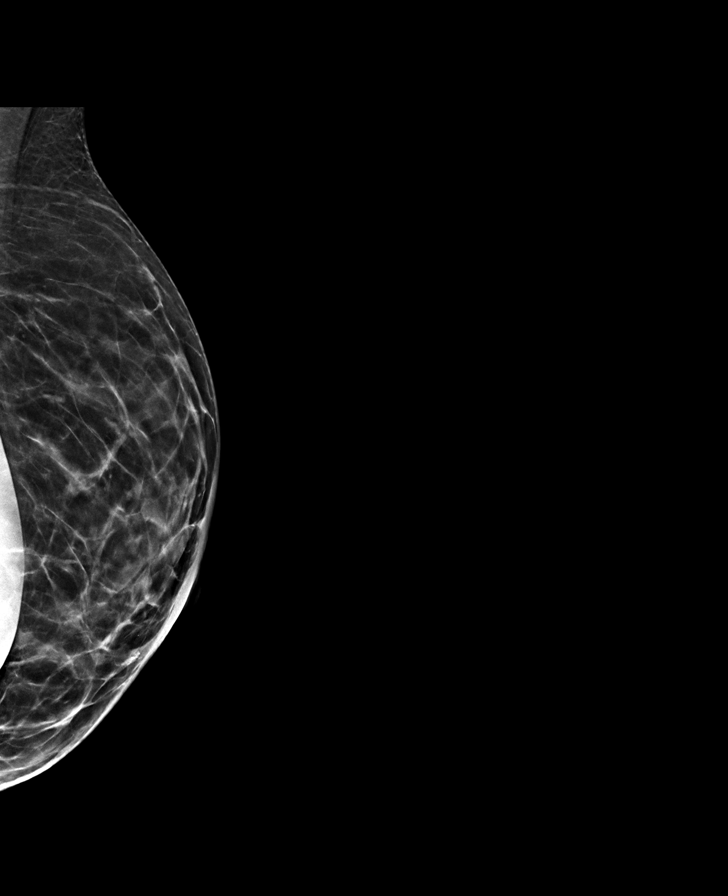

[R CC synth-2D]
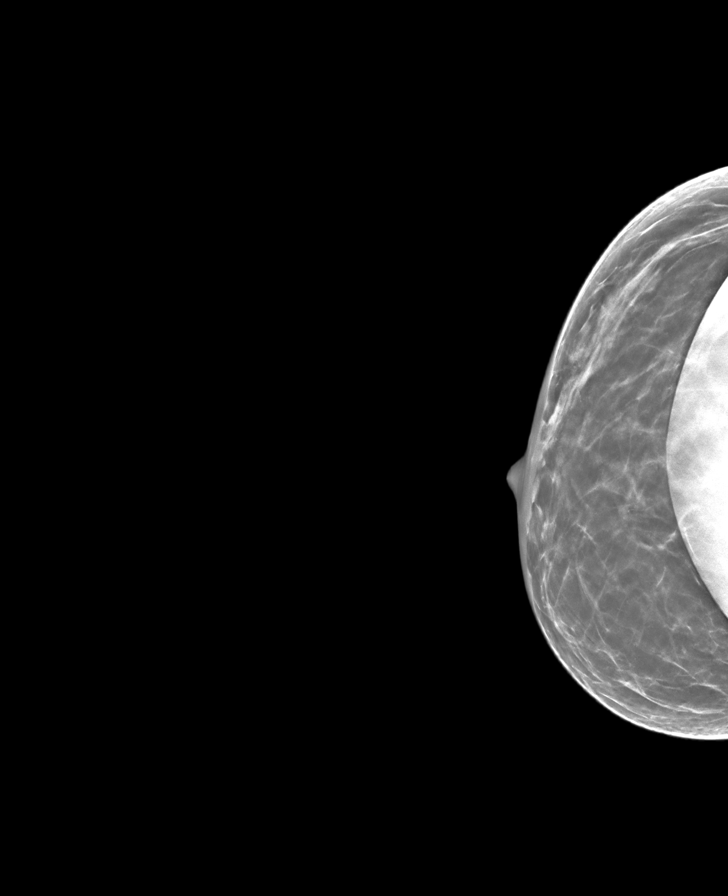

[8 of 28 positions shown; findings below may reference images not displayed]

ACR Breast Density Category b: There are scattered areas of
fibroglandular density.
FINDINGS: The patient has retropectoral implants. There are no findings
suspicious for malignancy.
IMPRESSION: No mammographic evidence of malignancy. A result letter of this
screening mammogram will be mailed directly to the patient.

RECOMMENDATION:
Screening mammogram in one year. (Code:[WS])

BI-RADS CATEGORY  1:  Negative.

## 2020-07-17 DIAGNOSIS — F411 Generalized anxiety disorder: Secondary | ICD-10-CM | POA: Diagnosis not present

## 2020-07-27 DIAGNOSIS — F411 Generalized anxiety disorder: Secondary | ICD-10-CM | POA: Diagnosis not present

## 2020-08-07 ENCOUNTER — Other Ambulatory Visit: Payer: Self-pay

## 2020-08-09 ENCOUNTER — Ambulatory Visit (INDEPENDENT_AMBULATORY_CARE_PROVIDER_SITE_OTHER): Payer: 59 | Admitting: Internal Medicine

## 2020-08-09 ENCOUNTER — Other Ambulatory Visit: Payer: Self-pay

## 2020-08-09 VITALS — BP 120/80 | HR 69 | Temp 97.8°F | Resp 16 | Ht 68.0 in | Wt 164.0 lb

## 2020-08-09 DIAGNOSIS — I10 Essential (primary) hypertension: Secondary | ICD-10-CM | POA: Diagnosis not present

## 2020-08-09 DIAGNOSIS — Z0000 Encounter for general adult medical examination without abnormal findings: Secondary | ICD-10-CM | POA: Diagnosis not present

## 2020-08-09 DIAGNOSIS — E0789 Other specified disorders of thyroid: Secondary | ICD-10-CM | POA: Diagnosis not present

## 2020-08-09 DIAGNOSIS — Z13220 Encounter for screening for lipoid disorders: Secondary | ICD-10-CM | POA: Diagnosis not present
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
## 2020-08-10 DIAGNOSIS — F411 Generalized anxiety disorder: Secondary | ICD-10-CM | POA: Diagnosis not present

## 2020-08-11 ENCOUNTER — Encounter: Payer: Self-pay | Admitting: Internal Medicine

## 2020-08-11 NOTE — Assessment & Plan Note (Signed)
Physical today 08/09/20.  PAP 08/09/19 - negative with negative HPV.  Mammogram 06/14/20 - Birads I.  Discussed due screening colonoscopy - age 45.

## 2020-08-11 NOTE — Assessment & Plan Note (Signed)
Saw Dr Gabriel Carina.  Recommended f/u thyroid ultrasound in 12 months.  Evaluated 02/2020.  Check tsh.

## 2020-08-11 NOTE — Assessment & Plan Note (Signed)
Blood pressure doing well on tenormin and triam/hctz.  Follow pressures.  Follow metabolic panel.  

## 2020-08-21 ENCOUNTER — Other Ambulatory Visit: Payer: Self-pay

## 2020-08-21 ENCOUNTER — Other Ambulatory Visit (INDEPENDENT_AMBULATORY_CARE_PROVIDER_SITE_OTHER): Payer: 59

## 2020-08-21 DIAGNOSIS — Z13220 Encounter for screening for lipoid disorders: Secondary | ICD-10-CM

## 2020-08-21 DIAGNOSIS — I10 Essential (primary) hypertension: Secondary | ICD-10-CM | POA: Diagnosis not present

## 2020-08-21 DIAGNOSIS — E0789 Other specified disorders of thyroid: Secondary | ICD-10-CM | POA: Diagnosis not present

## 2020-08-21 LAB — COMPREHENSIVE METABOLIC PANEL
ALT: 13 U/L (ref 0–35)
AST: 15 U/L (ref 0–37)
Albumin: 4.2 g/dL (ref 3.5–5.2)
Alkaline Phosphatase: 49 U/L (ref 39–117)
BUN: 17 mg/dL (ref 6–23)
CO2: 27 mEq/L (ref 19–32)
Calcium: 9.1 mg/dL (ref 8.4–10.5)
Chloride: 104 mEq/L (ref 96–112)
Creatinine, Ser: 0.81 mg/dL (ref 0.40–1.20)
GFR: 88.15 mL/min (ref >60.00)
Glucose, Bld: 83 mg/dL (ref 70–99)
Potassium: 4.1 mEq/L (ref 3.5–5.1)
Sodium: 139 mEq/L (ref 135–145)
Total Bilirubin: 0.8 mg/dL (ref 0.2–1.2)
Total Protein: 6.7 g/dL (ref 6.0–8.3)

## 2020-08-21 LAB — CBC WITH DIFFERENTIAL/PLATELET
Basophils Absolute: 0 10*3/uL (ref 0.0–0.1)
Basophils Relative: 0.7 % (ref 0.0–3.0)
Eosinophils Absolute: 0.1 10*3/uL (ref 0.0–0.7)
Eosinophils Relative: 2.7 % (ref 0.0–5.0)
HCT: 45.6 % (ref 36.0–46.0)
Hemoglobin: 15.2 g/dL — ABNORMAL HIGH (ref 12.0–15.0)
Lymphocytes Relative: 37.5 % (ref 12.0–46.0)
Lymphs Abs: 1.9 10*3/uL (ref 0.7–4.0)
MCHC: 33.2 g/dL (ref 30.0–36.0)
MCV: 91.8 fl (ref 78.0–100.0)
Monocytes Absolute: 0.6 10*3/uL (ref 0.1–1.0)
Monocytes Relative: 12.8 % — ABNORMAL HIGH (ref 3.0–12.0)
Neutro Abs: 2.3 10*3/uL (ref 1.4–7.7)
Neutrophils Relative %: 46.3 % (ref 43.0–77.0)
Platelets: 205 10*3/uL (ref 150.0–400.0)
RBC: 4.97 Mil/uL (ref 3.87–5.11)
RDW: 13.5 % (ref 11.5–15.5)
WBC: 5 10*3/uL (ref 4.0–10.5)

## 2020-08-21 LAB — LIPID PANEL
Cholesterol: 136 mg/dL (ref 0–200)
HDL: 54.7 mg/dL (ref >39.00)
LDL Cholesterol: 67 mg/dL (ref 0–99)
NonHDL: 81.33
Total CHOL/HDL Ratio: 2
Triglycerides: 71 mg/dL (ref 0.0–149.0)
VLDL: 14.2 mg/dL (ref 0.0–40.0)

## 2020-08-21 LAB — TSH: TSH: 0.84 u[IU]/mL (ref 0.35–4.50)

## 2020-08-21 LAB — T4, FREE: Free T4: 0.91 ng/dL (ref 0.60–1.60)

## 2020-08-23 ENCOUNTER — Other Ambulatory Visit: Payer: Self-pay

## 2020-08-23 ENCOUNTER — Other Ambulatory Visit: Payer: Self-pay | Admitting: Internal Medicine

## 2020-08-24 ENCOUNTER — Other Ambulatory Visit: Payer: Self-pay

## 2020-08-27 ENCOUNTER — Other Ambulatory Visit: Payer: Self-pay

## 2020-08-27 MED ORDER — TRIAMTERENE-HCTZ 37.5-25 MG PO TABS
1.0000 | ORAL_TABLET | Freq: Every day | ORAL | 3 refills | Status: DC
  Filled 2020-08-27: qty 90, 90d supply, fill #0
  Filled 2020-12-03: qty 90, 90d supply, fill #1
  Filled 2021-02-25: qty 90, 90d supply, fill #2
  Filled 2021-05-27: qty 90, 90d supply, fill #3

## 2020-08-27 MED ORDER — ATENOLOL 50 MG PO TABS
ORAL_TABLET | Freq: Every day | ORAL | 3 refills | Status: DC
Start: 2020-08-27 — End: 2021-07-17
  Filled 2020-08-27: qty 90, 90d supply, fill #0
  Filled 2020-12-03: qty 90, 90d supply, fill #1
  Filled 2021-02-25: qty 90, 90d supply, fill #2
  Filled 2021-05-27: qty 90, 90d supply, fill #3

## 2020-12-03 ENCOUNTER — Other Ambulatory Visit: Payer: Self-pay

## 2021-01-09 ENCOUNTER — Other Ambulatory Visit: Payer: Self-pay

## 2021-01-09 MED ORDER — AZITHROMYCIN 250 MG PO TABS
ORAL_TABLET | ORAL | 0 refills | Status: DC
  Filled 2021-01-09: qty 6, 5d supply, fill #0

## 2021-02-01 ENCOUNTER — Other Ambulatory Visit: Payer: Self-pay | Admitting: Orthopedic Surgery

## 2021-02-01 ENCOUNTER — Other Ambulatory Visit (HOSPITAL_BASED_OUTPATIENT_CLINIC_OR_DEPARTMENT_OTHER): Payer: Self-pay | Admitting: Orthopedic Surgery

## 2021-02-01 DIAGNOSIS — M25562 Pain in left knee: Secondary | ICD-10-CM | POA: Diagnosis not present

## 2021-02-01 DIAGNOSIS — S83272A Complex tear of lateral meniscus, current injury, left knee, initial encounter: Secondary | ICD-10-CM | POA: Diagnosis not present

## 2021-02-04 DIAGNOSIS — S92424A Nondisplaced fracture of distal phalanx of right great toe, initial encounter for closed fracture: Secondary | ICD-10-CM | POA: Diagnosis not present

## 2021-02-04 DIAGNOSIS — M79674 Pain in right toe(s): Secondary | ICD-10-CM | POA: Diagnosis not present

## 2021-02-13 ENCOUNTER — Ambulatory Visit: Payer: 59

## 2021-02-20 ENCOUNTER — Ambulatory Visit
Admission: RE | Admit: 2021-02-20 | Discharge: 2021-02-20 | Disposition: A | Discharge status: Home / Self Care | Payer: 59 | Source: Ambulatory Visit | Attending: Orthopedic Surgery | Admitting: Orthopedic Surgery

## 2021-02-20 ENCOUNTER — Other Ambulatory Visit: Payer: Self-pay

## 2021-02-20 DIAGNOSIS — S83272A Complex tear of lateral meniscus, current injury, left knee, initial encounter: Secondary | ICD-10-CM | POA: Insufficient documentation

## 2021-02-20 DIAGNOSIS — S83222A Peripheral tear of medial meniscus, current injury, left knee, initial encounter: Secondary | ICD-10-CM | POA: Diagnosis not present
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
## 2021-02-25 ENCOUNTER — Other Ambulatory Visit: Payer: Self-pay

## 2021-02-27 DIAGNOSIS — E042 Nontoxic multinodular goiter: Secondary | ICD-10-CM | POA: Diagnosis not present

## 2021-02-28 ENCOUNTER — Other Ambulatory Visit: Payer: Self-pay

## 2021-02-28 ENCOUNTER — Telehealth: Payer: Self-pay | Admitting: Internal Medicine

## 2021-02-28 DIAGNOSIS — K649 Unspecified hemorrhoids: Secondary | ICD-10-CM

## 2021-02-28 MED ORDER — HYDROCORTISONE 2.5 % EX CREA
TOPICAL_CREAM | CUTANEOUS | 3 refills | Status: DC
  Filled 2021-02-28: qty 28, 7d supply, fill #0

## 2021-02-28 NOTE — Telephone Encounter (Signed)
Patient requesting the hydrocortisone 2.5 % cream to be sent to the pharmacy

## 2021-03-06 DIAGNOSIS — E042 Nontoxic multinodular goiter: Secondary | ICD-10-CM | POA: Diagnosis not present

## 2021-04-18 DIAGNOSIS — H93293 Other abnormal auditory perceptions, bilateral: Secondary | ICD-10-CM | POA: Diagnosis not present

## 2021-04-25 ENCOUNTER — Encounter: Payer: Self-pay | Admitting: Internal Medicine

## 2021-04-29 ENCOUNTER — Other Ambulatory Visit: Payer: Self-pay

## 2021-05-28 ENCOUNTER — Other Ambulatory Visit: Payer: Self-pay

## 2021-06-11 ENCOUNTER — Telehealth: Payer: Self-pay | Admitting: Internal Medicine

## 2021-06-11 DIAGNOSIS — R102 Pelvic and perineal pain: Secondary | ICD-10-CM

## 2021-06-11 NOTE — Telephone Encounter (Signed)
Having problems with periods.  Painful periods. Discussed further evaluation.  Pelvic ultrasound ordered.  Will notify me if problems.

## 2021-06-20 ENCOUNTER — Other Ambulatory Visit: Payer: Self-pay

## 2021-06-20 ENCOUNTER — Telehealth: Payer: Self-pay | Admitting: Internal Medicine

## 2021-06-20 ENCOUNTER — Ambulatory Visit
Admission: RE | Admit: 2021-06-20 | Discharge: 2021-06-20 | Disposition: A | Discharge status: Home / Self Care | Payer: 59 | Source: Ambulatory Visit | Attending: Internal Medicine | Admitting: Internal Medicine

## 2021-06-20 DIAGNOSIS — D259 Leiomyoma of uterus, unspecified: Secondary | ICD-10-CM | POA: Diagnosis not present

## 2021-06-20 DIAGNOSIS — N858 Other specified noninflammatory disorders of uterus: Secondary | ICD-10-CM | POA: Diagnosis not present

## 2021-06-20 DIAGNOSIS — R102 Pelvic and perineal pain: Secondary | ICD-10-CM | POA: Insufficient documentation

## 2021-06-20 DIAGNOSIS — N83202 Unspecified ovarian cyst, left side: Secondary | ICD-10-CM | POA: Diagnosis not present

## 2021-06-20 DIAGNOSIS — R1909 Other intra-abdominal and pelvic swelling, mass and lump: Secondary | ICD-10-CM | POA: Diagnosis not present

## 2021-06-20 IMAGING — US US PELVIS COMPLETE WITH TRANSVAGINAL
1 series · 13 of 25 positions shown · non-contrast
Comparison: None

CLINICAL DATA: Pelvic pain

EXAM:
TRANSABDOMINAL AND TRANSVAGINAL ULTRASOUND OF PELVIS
TECHNIQUE: Both transabdominal and transvaginal ultrasound examinations of the
pelvis were performed. Transabdominal technique was performed for
global imaging of the pelvis including uterus, ovaries, adnexal
regions, and pelvic cul-de-sac. It was necessary to proceed with
endovaginal exam following the transabdominal exam to visualize the
uterus endometrium ovaries.

[Series 1: us pelvis complete with transvaginal · 0.25mm/px · 128 acquisitions, 13 frames shown]
[im 1/128]
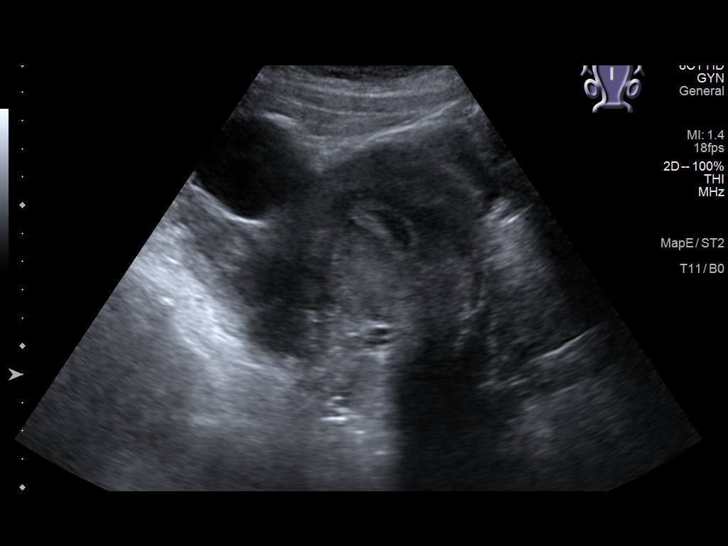
[im 11/128]
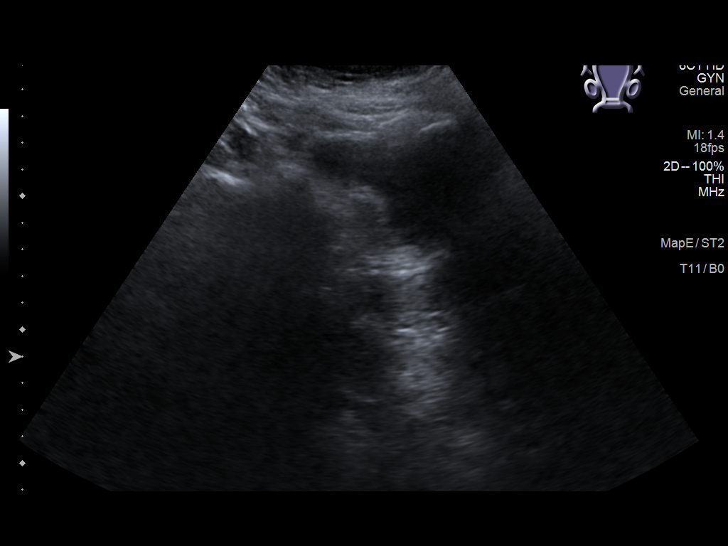
[im 22/128]
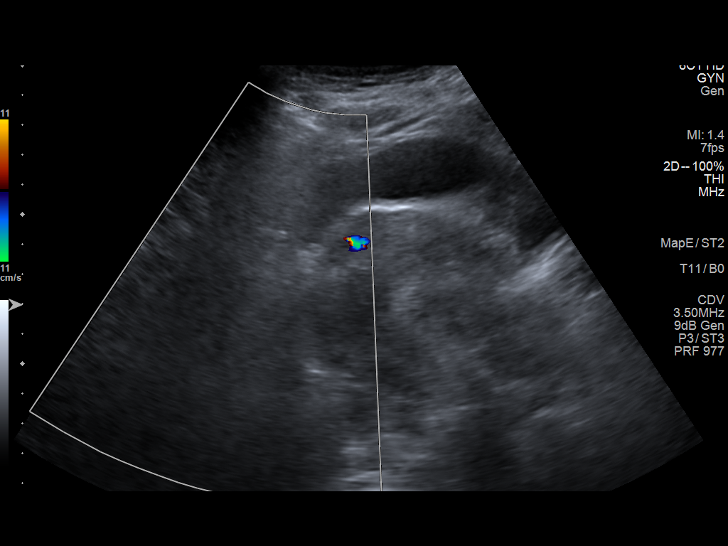
[im 32/128]
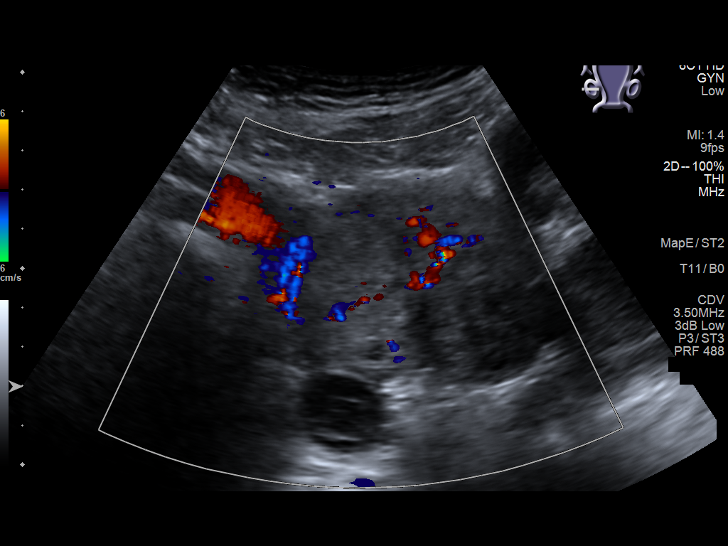
[im 43/128]
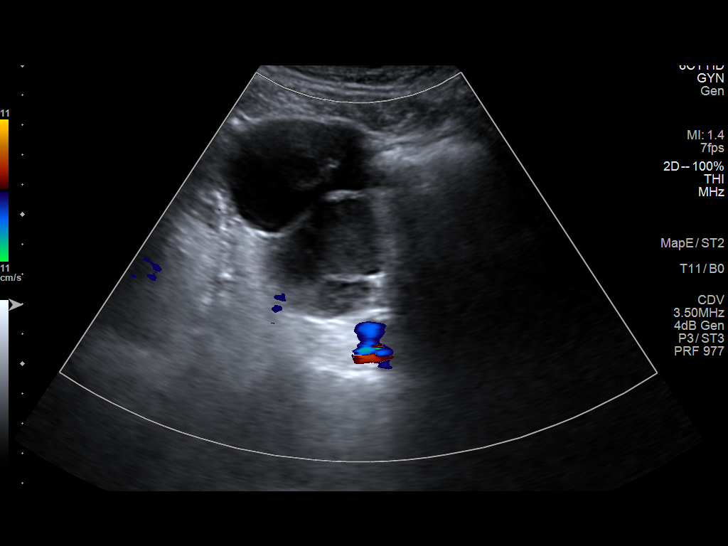
[im 53/128]
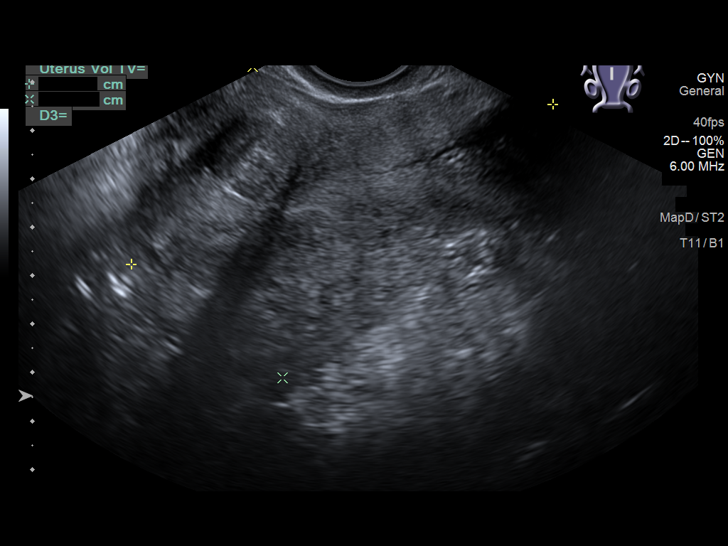
[im 64/128]
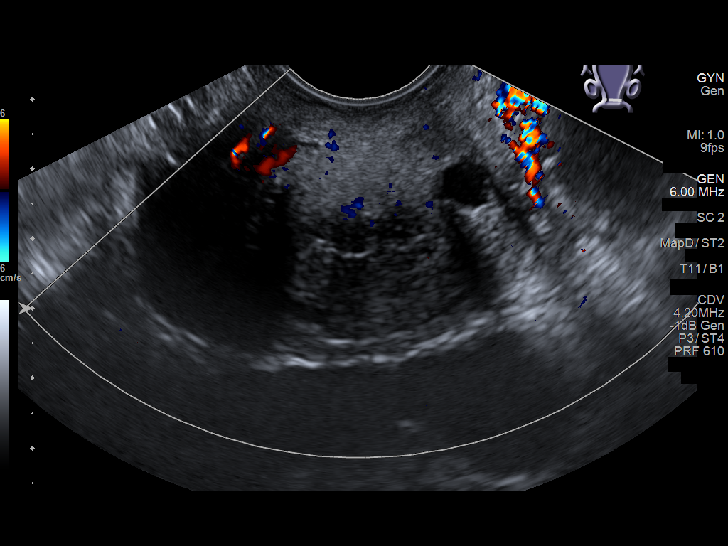
[im 75/128]
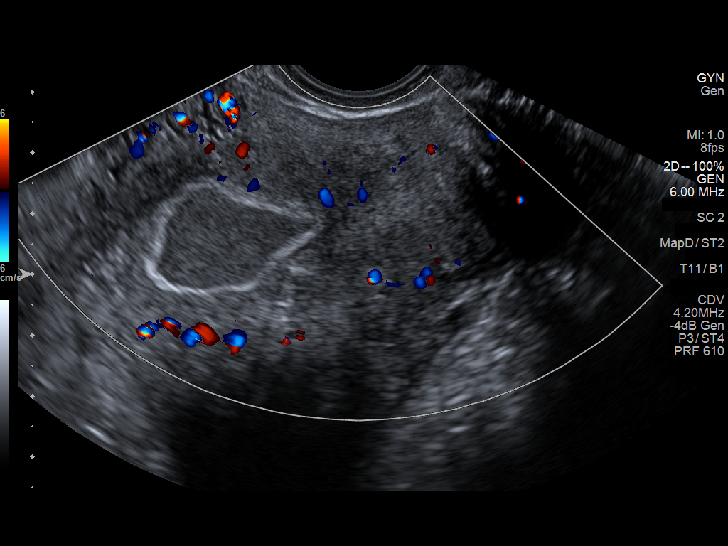
[im 85/128]
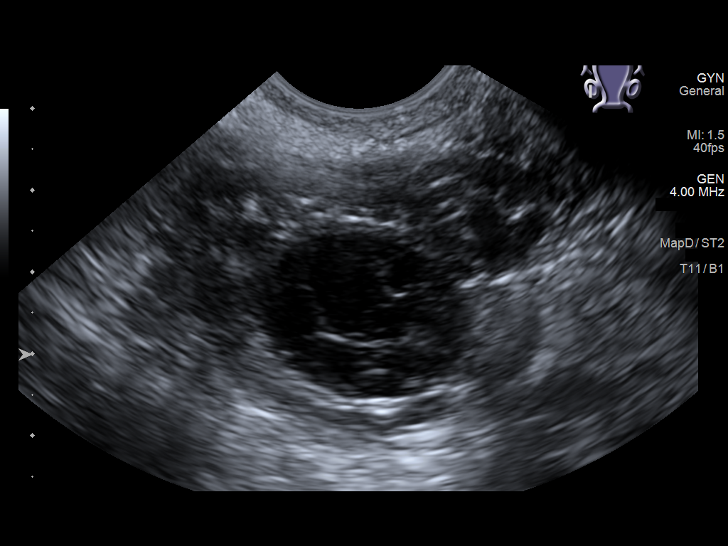
[im 96/128]
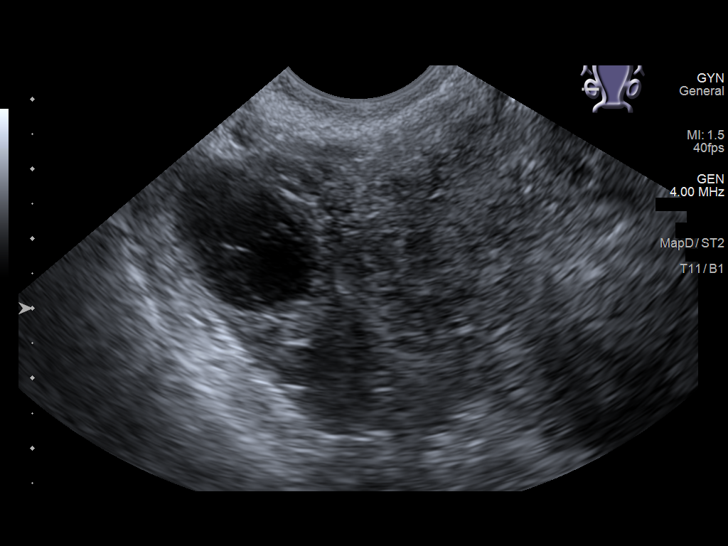
[im 106/128]
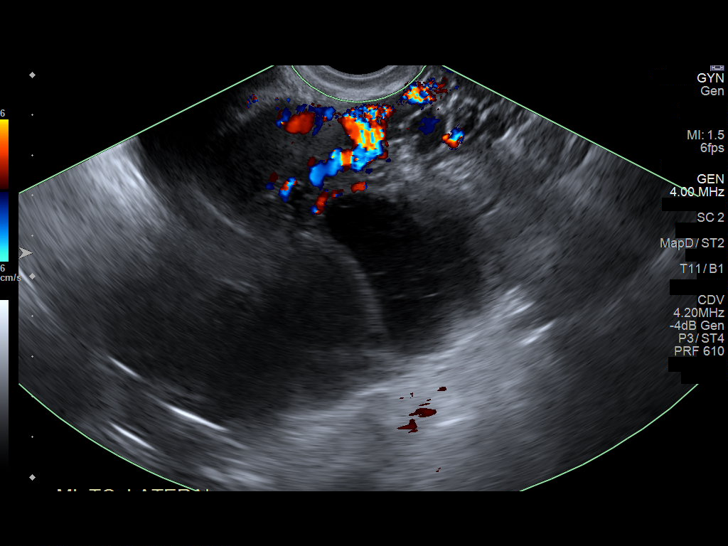
[im 117/128]
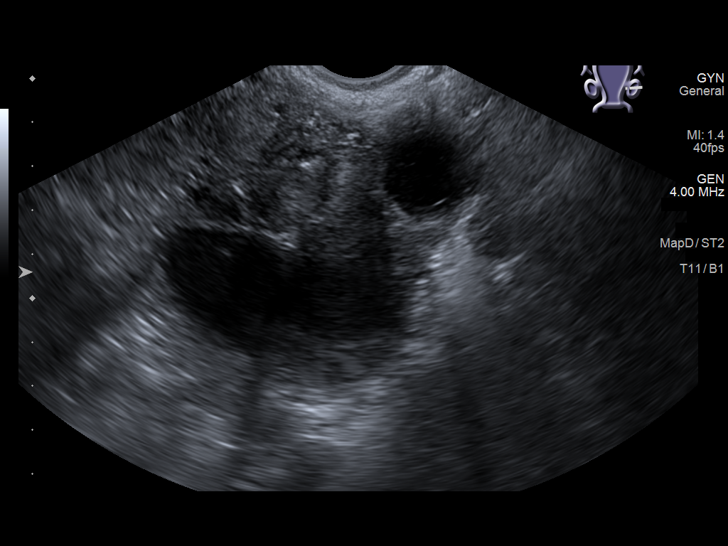
[im 128/128]
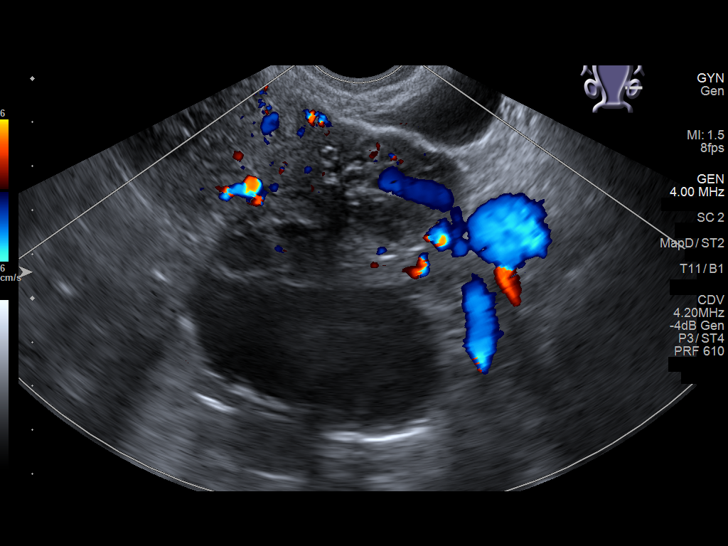

[13 of 25 positions shown; findings below may reference images not displayed]

FINDINGS: Uterus

Measurements: 9.3 x 6.5 x 7.9 cm = volume: 250 mL. Multiple uterine
masses probably fibroids. The 2 most distinct masses are measured.
Right fundal submucosal mass measuring 2.9 x 1.7 x 2.4 cm. Left
lower uterine segment posterior subserosal mass measuring 4.1 x
x 3.7 cm.

Endometrium

Thickness: 15.4 mm. Abnormal complex fluid within the endometrial
canal measuring up to 15.4 mm.

Right ovary

Measurements: 4.3 x 2.7 x 1.6 cm = volume: 9.2 mL. Complex cyst
measuring 2.7 by 2.2 x 2.9 cm suggestive of hemorrhagic cyst.

Left ovary

Measurements: 8.6 x 6.3 x 7.4 cm = volume: 207.9 mL. Complex left
adnexal cystic mass measuring 10.2 x 4.8 x 7.5 cm.

Other findings

Trace free fluid.
IMPRESSION: 1. Large complex left adnexal cystic mass measuring up to 10.2 cm,
differential considerations include complex cystic ovarian mass
versus dilated fallopian tube complicated by hemorrhage or infected
material. Surgical consultation is recommended.
2. Abnormal complex fluid distending the endometrial canal, raising
concern for distal obstructing process.
3. 2.9 cm possible hemorrhagic cyst in the right ovary. Recommend
6-12 week sonographic follow-up for this finding
4. Trace free fluid in the pelvis.
5. Uterine fibroids

These results will be called to the ordering clinician or
representative by the Radiologist Assistant, and communication
documented in the PACS or [REDACTED].

## 2021-06-20 NOTE — Telephone Encounter (Signed)
Results in epic. Dr. Nicki Reaper stated that she has already seen them.

## 2021-06-20 NOTE — Telephone Encounter (Signed)
Olivia Mackie calling from [facility] radiology wanted to give results from ultrasound please call [place] @ [phone]

## 2021-06-23 ENCOUNTER — Other Ambulatory Visit: Payer: Self-pay | Admitting: Internal Medicine

## 2021-06-23 DIAGNOSIS — N83202 Unspecified ovarian cyst, left side: Secondary | ICD-10-CM

## 2021-06-23 NOTE — Progress Notes (Signed)
Order placed for gyn referral.  

## 2021-06-24 ENCOUNTER — Telehealth: Payer: Self-pay

## 2021-06-24 NOTE — Telephone Encounter (Signed)
[facility] referring (urgent) for increased pain with menstrual periods.  Recent pelvic ultrasound revealed cystic ovarian mass. X 2 Left voicemail for patient to call back to be scheduled.

## 2021-06-24 NOTE — Telephone Encounter (Signed)
We have offered patient appointment with [facility] for 06/25/21. Patient will call back to confirm

## 2021-06-25 ENCOUNTER — Encounter: Payer: Self-pay | Admitting: Obstetrics & Gynecology

## 2021-06-26 ENCOUNTER — Other Ambulatory Visit: Payer: Self-pay | Admitting: Internal Medicine

## 2021-06-26 DIAGNOSIS — N946 Dysmenorrhea, unspecified: Secondary | ICD-10-CM

## 2021-06-26 DIAGNOSIS — N838 Other noninflammatory disorders of ovary, fallopian tube and broad ligament: Secondary | ICD-10-CM

## 2021-06-26 NOTE — Progress Notes (Signed)
Order placed for gyn referral to Dr Glennon Mac [facility]

## 2021-06-26 NOTE — Progress Notes (Signed)
Order placed for gyn referral.  

## 2021-06-28 ENCOUNTER — Other Ambulatory Visit: Payer: Self-pay | Admitting: Internal Medicine

## 2021-06-28 DIAGNOSIS — Z1231 Encounter for screening mammogram for malignant neoplasm of breast: Secondary | ICD-10-CM

## 2021-07-10 DIAGNOSIS — N83202 Unspecified ovarian cyst, left side: Secondary | ICD-10-CM | POA: Diagnosis not present

## 2021-07-17 ENCOUNTER — Inpatient Hospital Stay: Payer: 59

## 2021-07-17 ENCOUNTER — Inpatient Hospital Stay: Payer: 59 | Attending: Obstetrics and Gynecology | Admitting: Obstetrics and Gynecology

## 2021-07-17 ENCOUNTER — Other Ambulatory Visit: Payer: Self-pay

## 2021-07-17 VITALS — BP 118/80 | HR 64 | Resp 18 | Wt 158.0 lb

## 2021-07-17 DIAGNOSIS — N946 Dysmenorrhea, unspecified: Secondary | ICD-10-CM | POA: Insufficient documentation

## 2021-07-17 DIAGNOSIS — Z79899 Other long term (current) drug therapy: Secondary | ICD-10-CM | POA: Insufficient documentation

## 2021-07-17 DIAGNOSIS — N9489 Other specified conditions associated with female genital organs and menstrual cycle: Secondary | ICD-10-CM

## 2021-07-17 DIAGNOSIS — D3911 Neoplasm of uncertain behavior of right ovary: Secondary | ICD-10-CM | POA: Diagnosis not present

## 2021-07-17 DIAGNOSIS — D259 Leiomyoma of uterus, unspecified: Secondary | ICD-10-CM | POA: Insufficient documentation

## 2021-07-17 DIAGNOSIS — N939 Abnormal uterine and vaginal bleeding, unspecified: Secondary | ICD-10-CM | POA: Diagnosis not present

## 2021-07-17 DIAGNOSIS — I10 Essential (primary) hypertension: Secondary | ICD-10-CM | POA: Insufficient documentation

## 2021-07-17 DIAGNOSIS — N888 Other specified noninflammatory disorders of cervix uteri: Secondary | ICD-10-CM | POA: Insufficient documentation

## 2021-07-17 NOTE — Progress Notes (Signed)
Gynecologic Oncology Consult Visit   Referring Provider: Prentice Docker, MD  Chief Concern: cystic ovarian mass   Subjective:  Jennifer Olsen is a 46 y.o. female who is seen in consultation from Dr. Glennon Mac cystic ovarian mass diagnosed by ultrasound obtained to assess dysmenorrhea and increased uterine bleeding with menses.  She has a h/o of abnormal uterine bleeding and had an endometrial ablation at the age 45 yo. She still has monthly periods that are getting heavier. She presented to Dr. Nicki Reaper who ordered a pelvic ultrasound for evaluation.    06/20/2021 Pelvic US  FINDINGS: Uterus: Measurements: 9.3 x 6.5 x 7.9 cm = volume: 250 mL. Multiple uterine masses probably fibroids. The 2 most distinct masses are measured. Right fundal submucosal mass measuring 2.9 x 1.7 x 2.4 cm. Left lower uterine segment posterior subserosal mass measuring 4.1 x 3.1 x 3.7 cm.   Endometrium: Thickness: 15.4 mm. Abnormal complex fluid within the endometrial canal measuring up to 15.4 mm.   Right ovary: Measurements: 4.3 x 2.7 x 1.6 cm = volume: 9.2 mL. Complex cyst measuring 2.7 by 2.2 x 2.9 cm suggestive of hemorrhagic cyst.   Left ovary: Measurements: 8.6 x 6.3 x 7.4 cm = volume: 207.9 mL. Complex left adnexal cystic mass measuring 10.2 x 4.8 x 7.5 cm.    Trace free fluid.   IMPRESSION: 1. Large complex left adnexal cystic mass measuring up to 10.2 cm, versus dilated fallopian tube complicated by hemorrhage or infected material. Surgical consultation is recommended. 2. Abnormal complex fluid distending the endometrial canal, raising concern for distal obstructing process. 3. 2.9 cm possible hemorrhagic cyst in the right ovary. Recommend 6-12 week sonographic follow-up for this finding 4. Trace free fluid in the pelvis. 5. Uterine fibroids  Last pap smear 07/2019: NILIM, HPV negative  She was seen by Dr. Glennon Mac who offered observation with repeat the ultrasound 2-3 months from the time it was  first imaged; gynecologic oncology; or surgery to remove the mass. She presents for evaluation.   Problem List: Patient Active Problem List   Diagnosis Date Noted   Thyroid fullness 08/09/2019   Routine general medical examination at a health care facility 10/20/2012   Hypertension 10/09/2011   Hemorrhoids 10/09/2011   Contraception 10/09/2011    Past Medical History: Past Medical History:  Diagnosis Date   Hypertension    s/p renal artery doppler which was normal   Persistent headaches     Past Surgical History: Past Surgical History:  Procedure Laterality Date   AUGMENTATION MAMMAPLASTY Bilateral 05/2015   saline   FOOT SURGERY     bilateral, bunions, Dr. Laren Boom ABLATION  2010   Dr. Amalia Hailey   WRIST SURGERY  2011   left wrist, fracture, Dr. Mauri Pole    Past Gynecologic History:  Menarche: 12 Menstrual details: Lasts 3 - 5 days; long h/o dysmenorrhea Menses regular: yes History of OCP/HRT use: she declines OCP her mother had an aneurysm associated with estrogen History of Abnormal pap: no,  Last pap: 07/2019: NILIM, HPV negative History of STDs: reviewed  Sexually active: yes  OB History:  OB History  No obstetric history on file.    Family History: Family History  Problem Relation Age of Onset   Aneurysm Mother    Heart disease Mother    Breast cancer Neg Hx     Social History: Social History   Socioeconomic History   Marital status: Married    Spouse name: Not on file   Number  of children: Not on file   Years of education: Not on file   Highest education level: Not on file  Occupational History   Not on file  Tobacco Use   Smoking status: Never   Smokeless tobacco: Never  Substance and Sexual Activity   Alcohol use: No   Drug use: No   Sexual activity: Not on file  Other Topics Concern   Not on file  Social History Narrative   Lives in [place] with 3 children 16, 12, 5YO.  Dog in home.   Recently married.   Works - Health and safety inspector [employer]   Diet - regular   Exercise - walking 20-43mn daily   Social Determinants of Health   Financial Resource Strain: Not on file  Food Insecurity: Not on file  Transportation Needs: Not on file  Physical Activity: Not on file  Stress: Not on file  Social Connections: Not on file  Intimate Partner Violence: Not on file    Allergies: No Known Allergies  Current Medications: Current Outpatient Medications  Medication Sig Dispense Refill   atenolol (TENORMIN) 25 MG tablet Take by mouth daily.     hydrochlorothiazide (MICROZIDE) 12.5 MG capsule Take 12.5 mg by mouth daily.     Multiple Vitamin (MULTIVITAMIN) tablet Take 1 tablet by mouth daily.     hydrocortisone 2.5 % cream Apply to affected area as directed (Patient not taking: Reported on 07/17/2021) 28 g 3   No current facility-administered medications for this visit.    Review of Systems General: negative for fevers, changes in weight or night sweats Skin: negative for changes in moles or sores or rash Eyes: negative for changes in vision HEENT: negative for change in hearing, tinnitus, voice changes Pulmonary: negative for dyspnea, orthopnea, productive cough, wheezing Cardiac: negative for palpitations, pain Gastrointestinal: negative for nausea, vomiting, constipation, diarrhea, hematemesis, hematochezia Genitourinary/Sexual: negative for dysuria, retention, hematuria, incontinence Ob/Gyn:  as per HPI Musculoskeletal: negative for pain, joint pain, back pain Hematology: negative for easy bruising, abnormal bleeding Neurologic/Psych: negative for headaches, seizures, paralysis, weakness, numbness   Objective:  Physical Examination:  BP 118/80 (BP Location: Right Arm, Patient Position: Sitting)    Pulse 64    Resp 18    Wt 158 lb (71.7 kg)    SpO2 100%    BMI 24.02 kg/m     ECOG Performance Status: 0 - Asymptomatic  GENERAL: Patient is a well appearing female in no acute distress HEENT:  PERRL, neck  supple with midline trachea. Thyroid without masses.  NODES:  No cervical, supraclavicular, axillary, or inguinal lymphadenopathy palpated.  LUNGS:  Clear to auscultation bilaterally.   HEART:  Regular rate and rhythm.  ABDOMEN:  Soft, nontender. Nondistended, No ascites, masses, or hernias.  EXTREMITIES:  No peripheral edema.   NEURO:  Nonfocal. Well oriented.  Appropriate affect.  Pelvic chaperoned by CMA;  Vulva: normal appearing vulva with no masses, tenderness or lesions; Vagina: normal vagina. Cervix: multiparous with nabothian cysts. Uterus ~10 cm irregular c/w leiomyoma. BME: smooth mass in the right adnexa nontender, limited mobility but not fixed. On the left the cystic mass was not palpated.  No uterosacral or cul-de-sac nodularity. Parametria smooth.  Rectal: deferred.    Lab Review Labs on site today: CA125 and HE4 ordered  Radiologic Imaging:  06/20/2021 Pelvic [place]IND[place]: Uterus   Measurements: 9.3 x 6.5 x 7.9 cm = volume: 250 mL. Multiple uterine masses probably fibroids. The 2 most distinct masses are measured. Right fundal submucosal mass  measuring 2.9 x 1.7 x 2.4 cm. Left lower uterine segment posterior subserosal mass measuring 4.1 x 3.1 x 3.7 cm.   Endometrium   Thickness: 15.4 mm. Abnormal complex fluid within the endometrial canal measuring up to 15.4 mm.   Right ovary   Measurements: 4.3 x 2.7 x 1.6 cm = volume: 9.2 mL. Complex cyst measuring 2.7 by 2.2 x 2.9 cm suggestive of hemorrhagic cyst.   Left ovary   Measurements: 8.6 x 6.3 x 7.4 cm = volume: 207.9 mL. Complex left adnexal cystic mass measuring 10.2 x 4.8 x 7.5 cm.   Other findings   Trace free fluid.   IMPRESSION: 1. Large complex left adnexal cystic mass measuring up to 10.2 cm, differential considerations include complex cystic ovarian mass versus dilated fallopian tube complicated by hemorrhage or infected material. Surgical consultation is recommended. 2. Abnormal complex fluid  distending the endometrial canal, raising concern for distal obstructing process. 3. 2.9 cm possible hemorrhagic cyst in the right ovary. Recommend 6-12 week sonographic follow-up for this finding 4. Trace free fluid in the pelvis. 5. Uterine fibroids       Assessment:  Jennifer Olsen is a 46 y.o. female diagnosed with complex cystic mass ~10 cm associated with increased uterine bleeding and dysmenorrhea.   Medical co-morbidities complicating care: HTN.  Plan:   Problem List Items Addressed This Visit   None Visit Diagnoses     Adnexal mass    -  Primary   Relevant Orders   CA 125   Human Epididymis Prot 4,Serial       We discussed options for management and agree with Dr. Glennon Mac regarding follow up pelvic ultrasound and we have also ordered CA125 and HE4.   Suggested return to clinic after ultrasound in  ~4 weeks.    The patient's diagnosis, an outline of the further diagnostic and laboratory studies which will be required, the recommendation, and alternatives were discussed.  All questions were answered to the patient's satisfaction.  A total of 50 minutes were spent with the patient/family today; >50% was spent in education, counseling and coordination of care for complex cystic mass ~10 cm associated with increased uterine bleeding and dysmenorrhea.   I personally had a face to face interaction and evaluated the patient jointly with the NP, Ms. Beckey Rutter.  I have reviewed her history and available records and have performed the key portions of the physical exam including HEENT, general, abdominal exam, pelvic exam with my findings confirming those documented above by the APP.  I have discussed the case with the APP and the patient.  I agree with the above documentation, assessment and plan which was fully formulated by me.  Counseling was completed by me.   I personally saw the patient and performed a substantive portion of this encounter in conjunction with the listed APP  as documented above.  Tamaiya Bump Gaetana Michaelis, MD      CC:  Prentice Docker, MD Einar Pheasant, MD

## 2021-07-18 LAB — CA 125: Cancer Antigen (CA) 125: 167 U/mL — ABNORMAL HIGH (ref 0.0–38.1)

## 2021-07-18 LAB — HUMAN EPIDIDYMIS PROT 4,SERIAL: HE4: 53.3 pmol/L (ref 0.0–63.6)

## 2021-07-19 ENCOUNTER — Telehealth: Payer: Self-pay | Admitting: Nurse Practitioner

## 2021-07-19 DIAGNOSIS — N9489 Other specified conditions associated with female genital organs and menstrual cycle: Secondary | ICD-10-CM

## 2021-07-19 NOTE — Telephone Encounter (Signed)
Spoke to [place] and her husband Corene Cornea by phone. CA 125 elevated at 167, normal HE4. ROMA calculator at 9.8% (low risk). Reviewed with Dr. Theora Gianotti who recommends continuing as planned with ultrasound and follow up after. Patient and spouse are in agreement. Should she become symptomatic in the interim, she will call the clinic.  ?

## 2021-08-02 ENCOUNTER — Ambulatory Visit
Admission: RE | Admit: 2021-08-02 | Discharge: 2021-08-02 | Disposition: A | Discharge status: Home / Self Care | Payer: 59 | Source: Ambulatory Visit | Attending: Internal Medicine | Admitting: Internal Medicine

## 2021-08-02 ENCOUNTER — Other Ambulatory Visit: Payer: Self-pay

## 2021-08-02 DIAGNOSIS — Z1231 Encounter for screening mammogram for malignant neoplasm of breast: Secondary | ICD-10-CM | POA: Insufficient documentation

## 2021-08-02 IMAGING — MG DIGITAL SCREENING BREAST BILAT IMPLANT W/ TOMO W/ CAD
8 of 16 series · 8 of 40 positions shown · non-contrast
Comparison: Previous exam(s).

CLINICAL DATA: Screening.

EXAM:
DIGITAL SCREENING BILATERAL MAMMOGRAM WITH IMPLANTS, CAD AND
TOMOSYNTHESIS
TECHNIQUE: Bilateral screening digital craniocaudal and mediolateral oblique
mammograms were obtained. Bilateral screening digital breast
tomosynthesis was performed. The images were evaluated with
computer-aided detection. Standard and/or implant displaced views
were performed.

[R MLO]
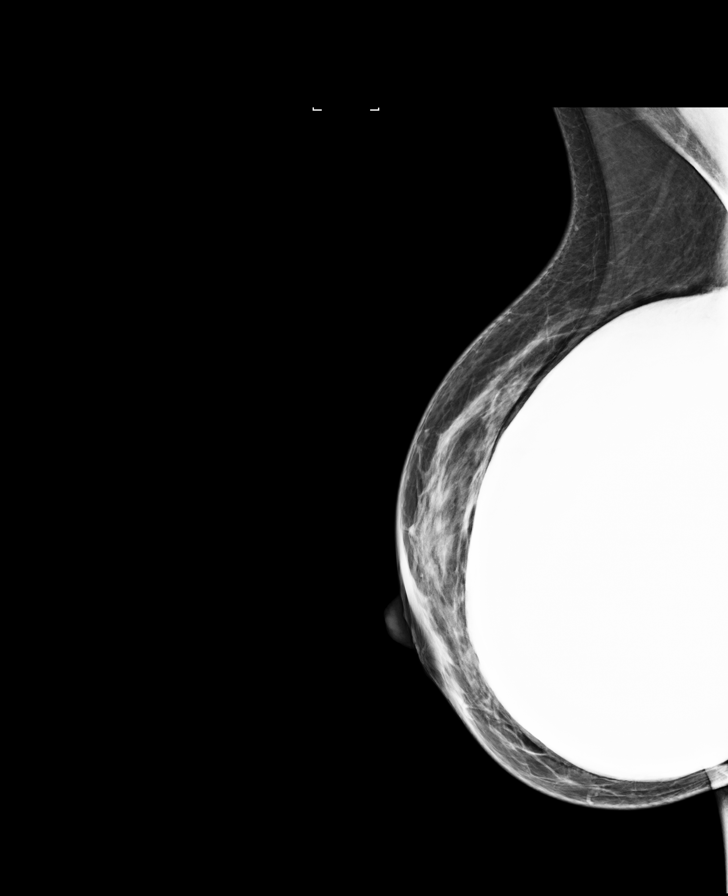

[L MLO]
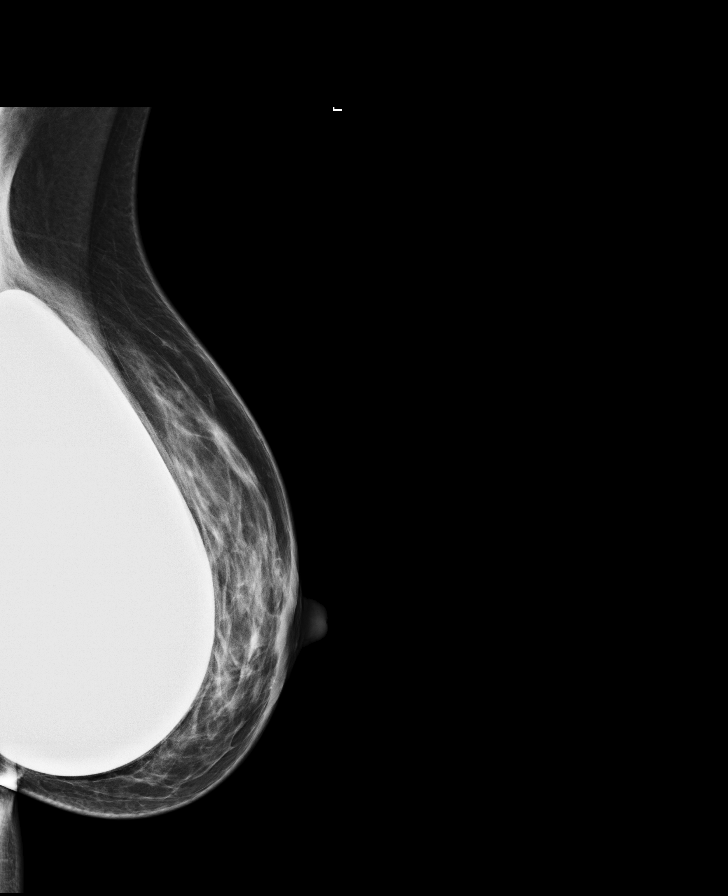

[R CC]
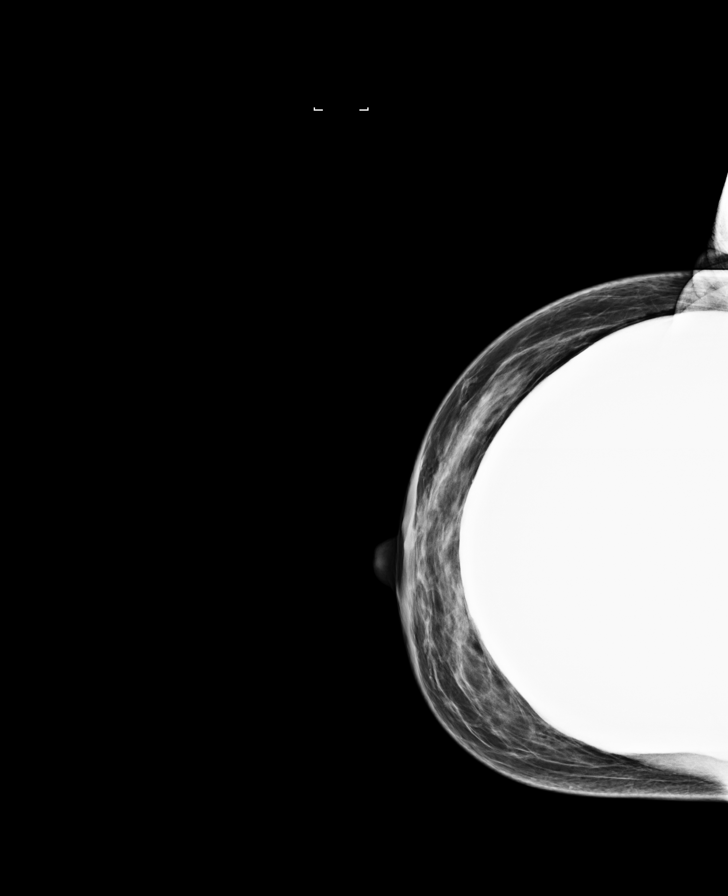

[L CC]
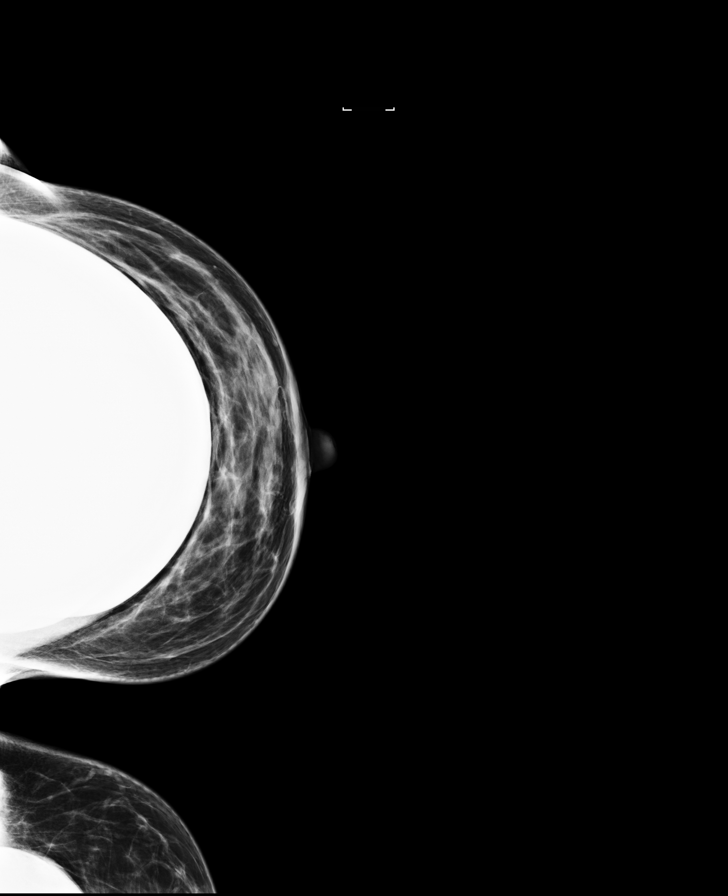

[R CC synth-2D]
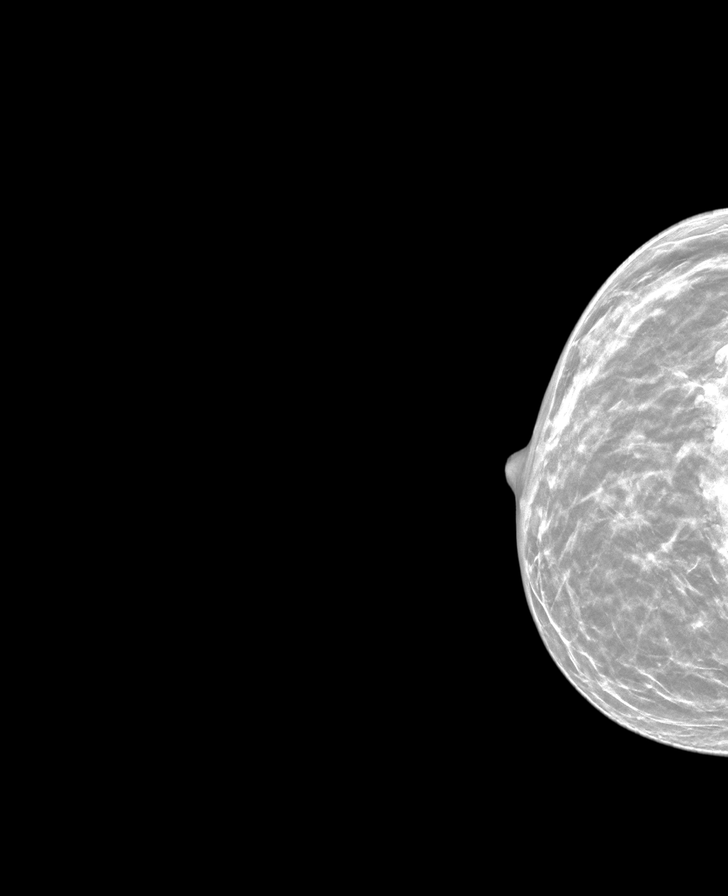

[R MLO synth-2D (1 of 2)]
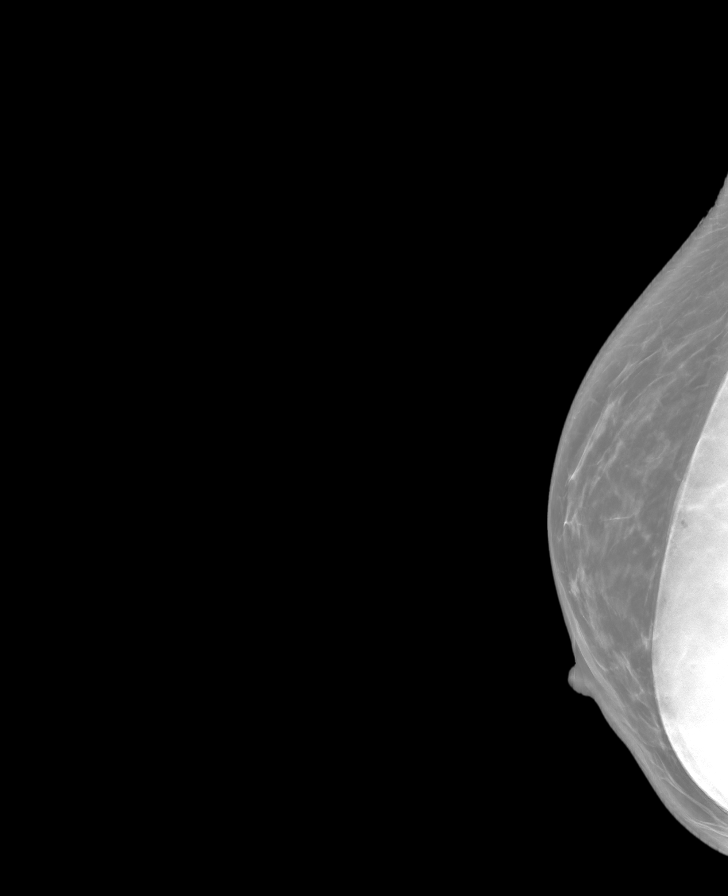

[R MLO synth-2D (2 of 2)]
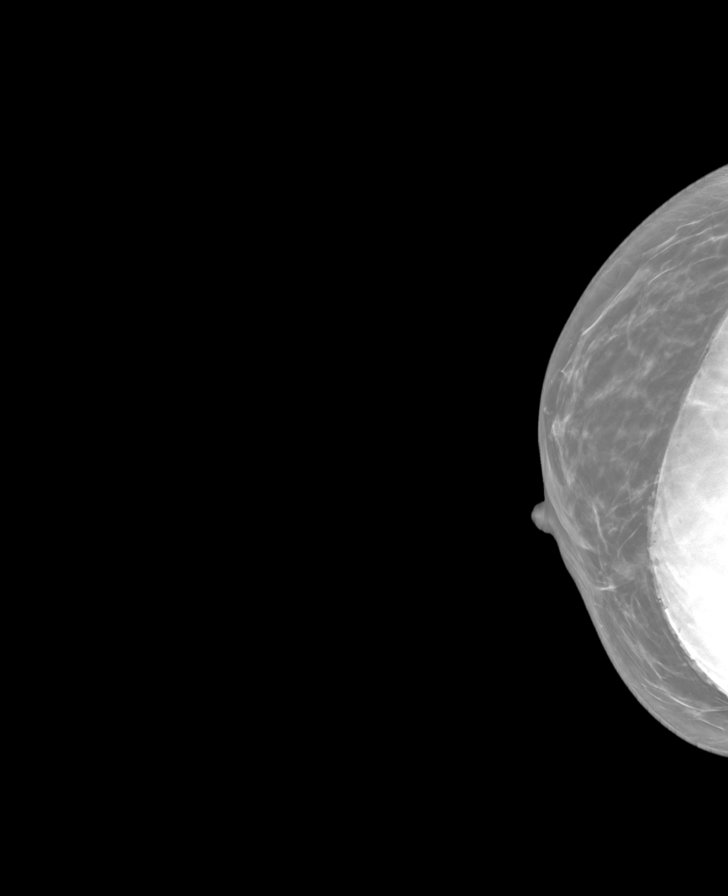

[L MLO synth-2D]
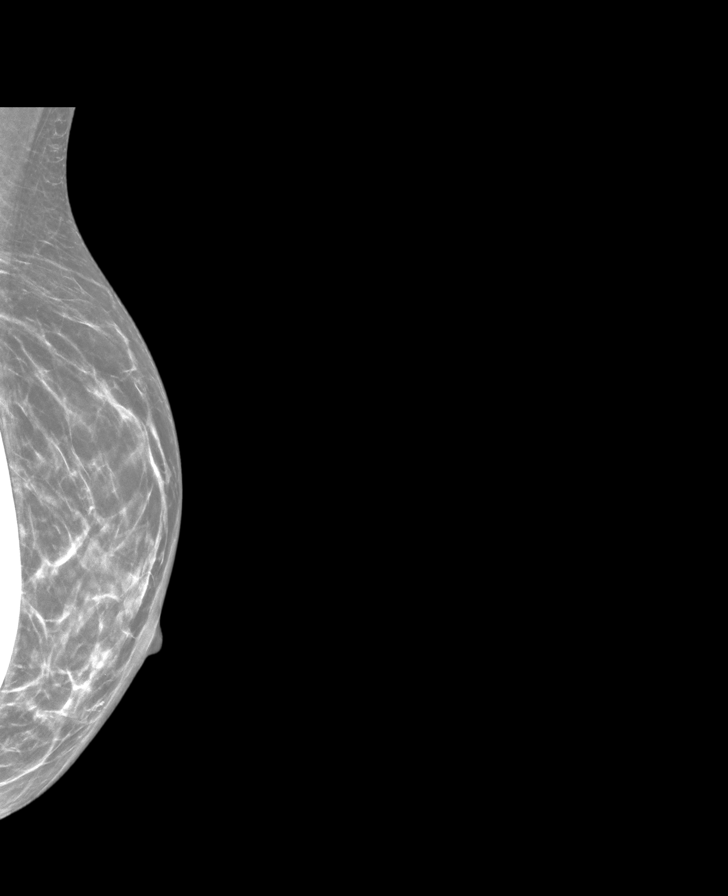

[8 of 40 positions shown; findings below may reference images not displayed]

ACR Breast Density Category c: The breast tissue is heterogeneously
dense, which may obscure small masses.
FINDINGS: The patient has retropectoral silicone implants. There are no
findings suspicious for malignancy. Extracapsular silicone is noted
in the right breast along the implant capsule on the implant
displaced views.
IMPRESSION: No mammographic evidence of malignancy.

Findings suggestive of extracapsular right implant rupture.

A result letter of this screening mammogram will be mailed directly
to the patient.

RECOMMENDATION:
Screening mammogram in one year. (Code:[04])

Breast implant integrity can be evaluated with a non-contrast
bilateral breast MRI.

BI-RADS CATEGORY  2: Benign.

## 2021-08-05 ENCOUNTER — Telehealth: Payer: Self-pay | Admitting: *Deleted

## 2021-08-05 DIAGNOSIS — Z13220 Encounter for screening for lipoid disorders: Secondary | ICD-10-CM

## 2021-08-05 DIAGNOSIS — E0789 Other specified disorders of thyroid: Secondary | ICD-10-CM

## 2021-08-05 DIAGNOSIS — I10 Essential (primary) hypertension: Secondary | ICD-10-CM

## 2021-08-05 NOTE — Telephone Encounter (Signed)
Please place future orders for lab appt.  

## 2021-08-05 NOTE — Telephone Encounter (Signed)
Orders placed for labs

## 2021-08-09 ENCOUNTER — Other Ambulatory Visit (INDEPENDENT_AMBULATORY_CARE_PROVIDER_SITE_OTHER): Payer: 59

## 2021-08-09 DIAGNOSIS — Z13220 Encounter for screening for lipoid disorders: Secondary | ICD-10-CM | POA: Diagnosis not present

## 2021-08-09 DIAGNOSIS — I10 Essential (primary) hypertension: Secondary | ICD-10-CM | POA: Diagnosis not present

## 2021-08-09 DIAGNOSIS — E0789 Other specified disorders of thyroid: Secondary | ICD-10-CM | POA: Diagnosis not present

## 2021-08-09 LAB — CBC WITH DIFFERENTIAL/PLATELET
Basophils Absolute: 0 10*3/uL (ref 0.0–0.1)
Basophils Relative: 0.8 % (ref 0.0–3.0)
Eosinophils Absolute: 0.1 10*3/uL (ref 0.0–0.7)
Eosinophils Relative: 1.8 % (ref 0.0–5.0)
HCT: 42.8 % (ref 36.0–46.0)
Hemoglobin: 14.3 g/dL (ref 12.0–15.0)
Lymphocytes Relative: 28.1 % (ref 12.0–46.0)
Lymphs Abs: 1.8 10*3/uL (ref 0.7–4.0)
MCHC: 33.5 g/dL (ref 30.0–36.0)
MCV: 92.9 fl (ref 78.0–100.0)
Monocytes Absolute: 0.8 10*3/uL (ref 0.1–1.0)
Monocytes Relative: 12 % (ref 3.0–12.0)
Neutro Abs: 3.7 10*3/uL (ref 1.4–7.7)
Neutrophils Relative %: 57.3 % (ref 43.0–77.0)
Platelets: 196 10*3/uL (ref 150.0–400.0)
RBC: 4.61 Mil/uL (ref 3.87–5.11)
RDW: 13.7 % (ref 11.5–15.5)
WBC: 6.4 10*3/uL (ref 4.0–10.5)

## 2021-08-09 LAB — COMPREHENSIVE METABOLIC PANEL
ALT: 11 U/L (ref 0–35)
AST: 16 U/L (ref 0–37)
Albumin: 4.7 g/dL (ref 3.5–5.2)
Alkaline Phosphatase: 50 U/L (ref 39–117)
BUN: 17 mg/dL (ref 6–23)
CO2: 28 mEq/L (ref 19–32)
Calcium: 9.2 mg/dL (ref 8.4–10.5)
Chloride: 104 mEq/L (ref 96–112)
Creatinine, Ser: 0.9 mg/dL (ref 0.40–1.20)
GFR: 77.16 mL/min (ref >60.00)
Glucose, Bld: 86 mg/dL (ref 70–99)
Potassium: 3.7 mEq/L (ref 3.5–5.1)
Sodium: 140 mEq/L (ref 135–145)
Total Bilirubin: 0.6 mg/dL (ref 0.2–1.2)
Total Protein: 6.7 g/dL (ref 6.0–8.3)

## 2021-08-09 LAB — LIPID PANEL
Cholesterol: 145 mg/dL (ref 0–200)
HDL: 56.5 mg/dL (ref >39.00)
LDL Cholesterol: 77 mg/dL (ref 0–99)
NonHDL: 88.09
Total CHOL/HDL Ratio: 3
Triglycerides: 56 mg/dL (ref 0.0–149.0)
VLDL: 11.2 mg/dL (ref 0.0–40.0)

## 2021-08-09 LAB — T4, FREE: Free T4: 0.95 ng/dL (ref 0.60–1.60)

## 2021-08-09 LAB — TSH: TSH: 1.6 u[IU]/mL (ref 0.35–5.50)

## 2021-08-12 ENCOUNTER — Other Ambulatory Visit: Payer: Self-pay

## 2021-08-12 ENCOUNTER — Encounter: Payer: Self-pay | Admitting: Internal Medicine

## 2021-08-12 ENCOUNTER — Ambulatory Visit (INDEPENDENT_AMBULATORY_CARE_PROVIDER_SITE_OTHER): Payer: 59 | Admitting: Internal Medicine

## 2021-08-12 VITALS — BP 114/78 | HR 65 | Temp 98.0°F | Resp 17 | Ht 68.0 in | Wt 157.8 lb

## 2021-08-12 DIAGNOSIS — Z0000 Encounter for general adult medical examination without abnormal findings: Secondary | ICD-10-CM

## 2021-08-12 DIAGNOSIS — Z1211 Encounter for screening for malignant neoplasm of colon: Secondary | ICD-10-CM

## 2021-08-12 DIAGNOSIS — I10 Essential (primary) hypertension: Secondary | ICD-10-CM

## 2021-08-12 DIAGNOSIS — Z9882 Breast implant status: Secondary | ICD-10-CM

## 2021-08-12 MED ORDER — NA SULFATE-K SULFATE-MG SULF 17.5-3.13-1.6 GM/177ML PO SOLN
1.0000 | Freq: Once | ORAL | 0 refills | Status: AC
  Filled 2021-08-12: qty 354, 1d supply, fill #0

## 2021-08-12 NOTE — Progress Notes (Addendum)
Patient ID: Jennifer Olsen, female   DOB: 09-10-1975, 46 y.o.   MRN: [id number] ? ? ?Subjective:  ? ? Patient ID: Jennifer Olsen, female    DOB: [birth year], 46 y.o.   MRN: [id number] ? ?This visit occurred during the SARS-CoV-2 public health emergency.  Safety protocols were in place, including screening questions prior to the visit, additional usage of staff PPE, and extensive cleaning of exam room while observing appropriate contact time as indicated for disinfecting solutions.  ? ?Patient here for her physical exam.  ? ?Chief Complaint  ?Patient presents with  ? Annual Exam  ?  CPE- no pap - pt reports feeling well.  ? .  ? ?HPI ?Recently evaluated for adnexal mass.  Saw Dr Theora Gianotti.  Recommended f/u ultrasound in 4 weeks.  She is scheduled for f/u.  Is exercising regularly.  Stays active.  No chest pain or sob reported.  No abdominal pain or bowel change reported.  Discussed mammogram.  Extracapsular silicone is noted in right breast.  Discussed f/u with Dr Jon Billings.- Psychologist, sport and exercise.  Also discussed due colonoscopy.   ? ? ?Past Medical History:  ?Diagnosis Date  ? Hypertension   ? s/p renal artery doppler which was normal  ? Persistent headaches   ? Thyroid nodule   ? ?Past Surgical History:  ?Procedure Laterality Date  ? AUGMENTATION MAMMAPLASTY Bilateral 05/2015  ? saline  ? FOOT SURGERY    ? bilateral, bunions, Dr. Elvina Mattes  ? [place] ABLATION  2010  ? Dr. Amalia Hailey  ? WRIST SURGERY  2011  ? left wrist, fracture, Dr. Mauri Pole  ? ?Family History  ?Problem Relation Age of Onset  ? Aneurysm Mother   ? Heart disease Mother   ? Breast cancer Neg Hx   ? ?Social History  ? ?Socioeconomic History  ? Marital status: Married  ?  Spouse name: Not on file  ? Number of children: Not on file  ? Years of education: Not on file  ? Highest education level: Not on file  ?Occupational History  ? Not on file  ?Tobacco Use  ? Smoking status: Never  ? Smokeless tobacco: Never  ?Vaping Use  ? Vaping Use: Never used  ?Substance and Sexual Activity  ? Alcohol  use: Yes  ?  Comment: Occasional  ? Drug use: No  ? Sexual activity: Not on file  ?Other Topics Concern  ? Not on file  ?Social History Narrative  ? Lives in [place] with 3 children 16, 12, 5YO.  Dog in home.  ? Recently married.  ? 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 Diet - regular  ? Exercise - walking 20-38mn daily  ? ?Social Determinants of Health  ? ?Financial Resource Strain: Not on file  ?Food Insecurity: Not on file  ?Transportation Needs: Not on file  ?Physical Activity: Not on file  ?Stress: Not on file  ?Social Connections: Not on file  ? ? ? ?Review of Systems  ?Constitutional:  Negative for appetite change and unexpected weight change.  ?HENT:  Negative for congestion and sinus pressure.   ?Respiratory:  Negative for cough, chest tightness and shortness of breath.   ?Cardiovascular:  Negative for chest pain, palpitations and leg swelling.  ?Gastrointestinal:  Negative for abdominal pain, diarrhea, nausea and vomiting.  ?Genitourinary:  Negative for difficulty urinating and dysuria.  ?Musculoskeletal:  Negative for joint swelling and myalgias.  ?Skin:  Negative for color change and rash.  ?Neurological:  Negative for dizziness, light-headedness and headaches.  ?Psychiatric/Behavioral:  Negative for agitation and dysphoric mood.   ? ?   ?Objective:  ?  ? ?BP 114/78 (BP Location: Left Arm, Patient Position: Sitting, Cuff Size: Small)   Pulse 65   Temp 98 ?F (36.7 ?C) (Oral)   Resp 17   Ht '5\' 8"'$  (1.727 m)   Wt 157 lb 12.8 oz (71.6 kg)   LMP 07/24/2021 (Approximate)   SpO2 99%   BMI 23.99 kg/m?  ?Wt Readings from Last 3 Encounters:  ?08/14/21 158 lb (71.7 kg)  ?08/12/21 157 lb 12.8 oz (71.6 kg)  ?07/17/21 158 lb (71.7 kg)  ? ? ?Physical Exam ?Vitals reviewed.  ?Constitutional:   ?   General: She is not in acute distress. ?   Appearance: Normal appearance.  ?HENT:  ?   Head: Normocephalic and atraumatic.  ?   Right Ear: External ear normal.  ?   Left Ear: External ear normal.  ?Eyes:  ?   General:  No scleral icterus.    ?   Right eye: No discharge.     ?   Left eye: No discharge.  ?   Conjunctiva/sclera: Conjunctivae normal.  ?Neck:  ?   Thyroid: No thyromegaly.  ?Cardiovascular:  ?   Rate and Rhythm: Normal rate and regular rhythm.  ?Pulmonary:  ?   Effort: No respiratory distress.  ?   Breath sounds: Normal breath sounds. No wheezing.  ?Abdominal:  ?   General: Bowel sounds are normal.  ?   Palpations: Abdomen is soft.  ?   Tenderness: There is no abdominal tenderness.  ?Musculoskeletal:     ?   General: No swelling or tenderness.  ?   Cervical back: Neck supple. No tenderness.  ?Lymphadenopathy:  ?   Cervical: No cervical adenopathy.  ?Skin: ?   Findings: No erythema or rash.  ?Neurological:  ?   Mental Status: She is alert.  ?Psychiatric:     ?   Mood and Affect: Mood normal.     ?   Behavior: Behavior normal.  ? ? ? ?Outpatient Encounter Medications as of 08/12/2021  ?Medication Sig  ? atenolol (TENORMIN) 25 MG tablet Take by mouth daily.  ? Fexofenadine HCl (ALLEGRA ALLERGY PO) Take by mouth as needed.  ? hydrochlorothiazide (MICROZIDE) 12.5 MG capsule Take 12.5 mg by mouth daily.  ? hydrocortisone 2.5 % cream Apply to affected area as directed (Patient taking differently: as needed. Apply to affected area as directed)  ? Multiple Vitamin (MULTIVITAMIN) tablet Take 1 tablet by mouth daily.  ? ?No facility-administered encounter medications on file as of 08/12/2021.  ?  ? ?Lab Results  ?Component Value Date  ? WBC 6.4 08/09/2021  ? HGB 14.3 08/09/2021  ? HCT 42.8 08/09/2021  ? PLT 196.0 08/09/2021  ? GLUCOSE 86 08/09/2021  ? CHOL 145 08/09/2021  ? TRIG 56.0 08/09/2021  ? HDL 56.50 08/09/2021  ? [place] 77 08/09/2021  ? ALT 11 08/09/2021  ? AST 16 08/09/2021  ? NA 140 08/09/2021  ? K 3.7 08/09/2021  ? CL 104 08/09/2021  ? CREATININE 0.90 08/09/2021  ? BUN 17 08/09/2021  ? CO2 28 08/09/2021  ? TSH 1.60 08/09/2021  ? MICROALBUR 1.7 01/18/2016  ? ? ?MM 3D SCREEN BREAST W/IMPLANT BILATERAL ? ?Result Date:  08/02/2021 ?CLINICAL DATA:  Screening. EXAM: DIGITAL SCREENING BILATERAL MAMMOGRAM WITH IMPLANTS, CAD AND TOMOSYNTHESIS TECHNIQUE: Bilateral screening digital craniocaudal and mediolateral oblique mammograms were obtained. Bilateral screening digital breast tomosynthesis was performed. The images were evaluated with computer-aided  detection. Standard and/or implant displaced views were performed. COMPARISON:  Previous exam(s). ACR Breast Density Category c: The breast tissue is heterogeneously dense, which may obscure small masses. FINDINGS: The patient has retropectoral silicone implants. There are no findings suspicious for malignancy. Extracapsular silicone is noted in the right breast along the implant capsule on the implant displaced views. IMPRESSION: No mammographic evidence of malignancy. Findings suggestive of extracapsular right implant rupture. A result letter of this screening mammogram will be mailed directly to the patient. RECOMMENDATION: Screening mammogram in one year. (Code:SM-B-01Y) Breast implant integrity can be evaluated with a non-contrast bilateral breast MRI. BI-RADS CATEGORY  2: Benign. Electronically Signed   By: Ileana Roup M.D.   On: 08/02/2021 13:52  ? ? ?   ?Assessment & Plan:  ? ?Problem List Items Addressed This Visit   ? ? Breast implant status  ?  Mammogram as outlined - birads II - findings suggestive of extracapsular right implant rupture. Discussed further evaluation.  Prefers to have surgeon evaluate.  Refer to Dr Sable Feil.   ?  ?  ? Relevant Orders  ? Ambulatory referral to Plastic Surgery  ? Hypertension  ?  Blood pressure doing well on tenormin and triam/hctz.  Follow pressures.  Follow metabolic panel.  ?  ?  ? Routine general medical examination at a health care facility - Primary  ?  Physical today 08/12/21.  PAP 08/09/19 - negative with negative HPV. Discussed colonoscopy - now 6.  Agreeable for referral.  Mammogram 07/13/21 - Birads II.  ?  ?  ? ?Other Visit Diagnoses    ? ? Screening for colon cancer      ? Relevant Orders  ? Ambulatory referral to Gastroenterology  ? Colon cancer screening      ? ?  ? ? ? ?Einar Pheasant, MD  ?

## 2021-08-12 NOTE — Progress Notes (Signed)
Gastroenterology Pre-Procedure Review ?PATIENT REQUESTED Jennifer Olsen NOT Jennifer Olsen SO SCHEDULED WITH Jennifer Olsen ?Request Date: 08/19/2021 ?Requesting Physician: Dr. Allen Norris ? ?PATIENT REVIEW QUESTIONS: The patient responded to the following health history questions as indicated:   ? ?1. Are you having any GI issues? no ?2. Do you have a personal history of Polyps? no ?3. Do you have a family history of Colon Cancer or Polyps? yes (mother polyps) ?4. Diabetes Mellitus? no ?5. Joint replacements in the past 12 months?no ?6. Major health problems in the past 3 months?yes (cist on ovaries size of softball) ?7. Any artificial heart valves, MVP, or defibrillator?no ?   ?MEDICATIONS & ALLERGIES:    ?Patient reports the following regarding taking any anticoagulation/antiplatelet therapy:   ?Plavix, Coumadin, Eliquis, Xarelto, Lovenox, Pradaxa, Brilinta, or Effient? no ?Aspirin? no ? ?Patient confirms/reports the following medications:  ?Current Outpatient Medications  ?Medication Sig Dispense Refill  ? atenolol (TENORMIN) 25 MG tablet Take by mouth daily.    ? Fexofenadine HCl (ALLEGRA ALLERGY PO) Take by mouth as needed.    ? hydrochlorothiazide (MICROZIDE) 12.5 MG capsule Take 12.5 mg by mouth daily.    ? hydrocortisone 2.5 % cream Apply to affected area as directed (Patient taking differently: as needed. Apply to affected area as directed) 28 g 3  ? Multiple Vitamin (MULTIVITAMIN) tablet Take 1 tablet by mouth daily.    ? ?No current facility-administered medications for this visit.  ? ? ?Patient confirms/reports the following allergies:  ?No Known Allergies ? ?No orders of the defined types were placed in this encounter. ? ? ?AUTHORIZATION INFORMATION ?Primary Insurance: ?1D#: ?Group #: ? ?Secondary Insurance: ?1D#: ?Group #: ? ?SCHEDULE INFORMATION: ?Date: 08/19/2021 ?Time: ?Location:[facility] ? ?

## 2021-08-13 DIAGNOSIS — Z9882 Breast implant status: Secondary | ICD-10-CM | POA: Insufficient documentation

## 2021-08-13 NOTE — Assessment & Plan Note (Addendum)
Physical today 08/12/21.  PAP 08/09/19 - negative with negative HPV. Discussed colonoscopy - now 43.  Agreeable for referral.  Mammogram 07/13/21 - Birads II.  ?

## 2021-08-13 NOTE — Assessment & Plan Note (Signed)
Mammogram as outlined - birads II - findings suggestive of extracapsular right implant rupture. Discussed further evaluation.  Prefers to have surgeon evaluate.  Refer to Dr Sable Feil.   ?

## 2021-08-13 NOTE — Assessment & Plan Note (Signed)
Blood pressure doing well on tenormin and triam/hctz.  Follow pressures.  Follow metabolic panel.  

## 2021-08-14 ENCOUNTER — Encounter: Payer: Self-pay | Admitting: Gastroenterology

## 2021-08-17 NOTE — Addendum Note (Signed)
Addended by: Alisa Graff on: 08/17/2021 06:09 AM ? ? Modules accepted: Orders ? ?

## 2021-08-19 ENCOUNTER — Ambulatory Visit: Payer: 59 | Admitting: Anesthesiology

## 2021-08-19 ENCOUNTER — Ambulatory Visit
Admission: RE | Admit: 2021-08-19 | Discharge: 2021-08-19 | Disposition: A | Discharge status: Home / Self Care | Payer: 59 | Attending: Gastroenterology | Admitting: Gastroenterology

## 2021-08-19 ENCOUNTER — Other Ambulatory Visit: Payer: Self-pay

## 2021-08-19 ENCOUNTER — Encounter: Payer: Self-pay | Admitting: Gastroenterology

## 2021-08-19 ENCOUNTER — Encounter
Admission: RE | Disposition: A | Discharge status: Home / Self Care | Payer: Self-pay | Source: Home / Self Care | Attending: Gastroenterology

## 2021-08-19 DIAGNOSIS — Z1211 Encounter for screening for malignant neoplasm of colon: Secondary | ICD-10-CM | POA: Diagnosis not present

## 2021-08-19 DIAGNOSIS — K641 Second degree hemorrhoids: Secondary | ICD-10-CM | POA: Diagnosis not present

## 2021-08-19 DIAGNOSIS — Z79899 Other long term (current) drug therapy: Secondary | ICD-10-CM | POA: Diagnosis not present

## 2021-08-19 DIAGNOSIS — I10 Essential (primary) hypertension: Secondary | ICD-10-CM | POA: Insufficient documentation

## 2021-08-19 HISTORY — PX: COLONOSCOPY WITH PROPOFOL: SHX5780

## 2021-08-19 HISTORY — DX: Nontoxic single thyroid nodule: E04.1

## 2021-08-19 LAB — POCT PREGNANCY, URINE: Preg Test, Ur: NEGATIVE

## 2021-08-19 SURGERY — COLONOSCOPY WITH PROPOFOL
Anesthesia: General

## 2021-08-19 MED ORDER — ACETAMINOPHEN 325 MG PO TABS: 325.0000 mg | ORAL_TABLET | ORAL | Status: DC | PRN

## 2021-08-19 MED ORDER — ACETAMINOPHEN 160 MG/5ML PO SOLN: 325.0000 mg | ORAL | Status: DC | PRN

## 2021-08-19 MED ORDER — LACTATED RINGERS IV SOLN: INTRAVENOUS | Status: DC

## 2021-08-19 MED ORDER — SODIUM CHLORIDE 0.9 % IV SOLN: INTRAVENOUS | Status: DC

## 2021-08-19 MED ORDER — PROPOFOL 10 MG/ML IV BOLUS
INTRAVENOUS | Status: DC | PRN
  Administered 2021-08-19: 90 mg via INTRAVENOUS
  Administered 2021-08-19 (×4): 50 mg via INTRAVENOUS

## 2021-08-19 MED ORDER — LIDOCAINE HCL (CARDIAC) PF 100 MG/5ML IV SOSY
PREFILLED_SYRINGE | INTRAVENOUS | Status: DC | PRN
  Administered 2021-08-19: 40 mg via INTRAVENOUS

## 2021-08-19 MED ORDER — STERILE WATER FOR IRRIGATION IR SOLN
Status: DC | PRN
  Administered 2021-08-19: 1000 mL

## 2021-08-19 SURGICAL SUPPLY — 22 items

## 2021-08-19 NOTE — H&P (Signed)
? ?Jennifer Lame, MD [facility] ?[address] ?[place], [place] [address] ?Phone: [phone] ?Fax : [phone] ? ?Primary Care Physician:  Einar Pheasant, MD ?Primary Gastroenterologist:  Dr. Allen Norris ? ?Pre-Procedure History & Physical: ?HPI:  Jennifer Olsen is a 46 y.o. female is here for a screening colonoscopy.  ? ?Past Medical History:  ?Diagnosis Date  ? Hypertension   ? s/p renal artery doppler which was normal  ? Persistent headaches   ? Thyroid nodule   ? ? ?Past Surgical History:  ?Procedure Laterality Date  ? AUGMENTATION MAMMAPLASTY Bilateral 05/2015  ? saline  ? FOOT SURGERY    ? bilateral, bunions, Dr. Elvina Mattes  ? [place] ABLATION  2010  ? Dr. Amalia Hailey  ? WRIST SURGERY  2011  ? left wrist, fracture, Dr. Mauri Pole  ? ? ?Prior to Admission medications   ?Medication Sig Start Date End Date Taking? Authorizing Provider  ?atenolol (TENORMIN) 25 MG tablet Take by mouth daily.   Yes [provider]  ?Fexofenadine HCl (ALLEGRA ALLERGY PO) Take by mouth as needed.   Yes [provider]  ?hydrochlorothiazide (MICROZIDE) 12.5 MG capsule Take 12.5 mg by mouth daily.   Yes [provider]  ?Multiple Vitamin (MULTIVITAMIN) tablet Take 1 tablet by mouth daily.   Yes [provider]  ?hydrocortisone 2.5 % cream Apply to affected area as directed ?Patient taking differently: as needed. Apply to affected area as directed 02/28/21   Einar Pheasant, MD  ? ? ?Allergies as of 08/12/2021  ? (No Known Allergies)  ? ? ?Family History  ?Problem Relation Age of Onset  ? Aneurysm Mother   ? Heart disease Mother   ? Breast cancer Neg Hx   ? ? ?Social History  ? ?Socioeconomic History  ? Marital status: Married  ?  Spouse name: Not on file  ? Number of children: Not on file  ? Years of education: Not on file  ? Highest education level: Not on file  ?Occupational History  ? Not on file  ?Tobacco Use  ? Smoking status: Never  ? Smokeless tobacco: Never  ?Vaping Use  ? Vaping Use: Never used  ?Substance  and Sexual Activity  ? Alcohol use: Yes  ?  Comment: Occasional  ? Drug use: No  ? Sexual activity: Not on file  ?Other Topics Concern  ? Not on file  ?Social History Narrative  ? Lives in [place] with 3 children 16, 12, 5YO.  Dog in home.  ? Recently married.  ? 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 Diet - regular  ? Exercise - walking 20-67mn daily  ? ?Social Determinants of Health  ? ?Financial Resource Strain: Not on file  ?Food Insecurity: Not on file  ?Transportation Needs: Not on file  ?Physical Activity: Not on file  ?Stress: Not on file  ?Social Connections: Not on file  ?Intimate Partner Violence: Not on file  ? ? ?Review of Systems: ?See HPI, otherwise negative ROS ? ?Physical Exam: ?BP 120/81   Pulse 63   Temp 97.8 ?F (36.6 ?C) (Temporal)   Resp 18   Ht '5\' 8"'$  (1.727 m)   Wt 67.8 kg   LMP 07/24/2021 (Approximate) Comment: u preg neg  SpO2 96%   BMI 22.73 kg/m?  ?General:   Alert,  pleasant and cooperative in NAD ?Head:  Normocephalic and atraumatic. ?Neck:  Supple; no masses or thyromegaly. ?Lungs:  Clear throughout to auscultation.    ?Heart:  Regular rate and rhythm. ?Abdomen:  Soft, nontender  and nondistended. Normal bowel sounds, without guarding, and without rebound.   ?Neurologic:  Alert and  oriented x4;  grossly normal neurologically. ? ?Impression/Plan: ?Jennifer Olsen is now here to undergo a screening colonoscopy. ? ?Risks, benefits, and alternatives regarding colonoscopy have been reviewed with the patient.  Questions have been answered.  All parties agreeable. ?

## 2021-08-19 NOTE — Op Note (Signed)
[facility] ?Gastroenterology ?Patient Name: Jennifer Olsen ?Procedure Date: 08/19/2021 8:51 AM ?MRN: [id number] ?Account #: [id number] ?Date of Birth: Oct 21, 1975 ?Admit Type: Outpatient ?Age: 46 ?Room: [facility] OR ROOM 01 ?Gender: Female ?Note Status: Finalized ?Instrument Name: [id number] ?Procedure:             Colonoscopy ?Indications:           Screening for colorectal malignant neoplasm ?Providers:             Lucilla Lame MD, MD ?Referring MD:          Einar Pheasant, MD (Referring MD) ?Medicines:             Propofol per Anesthesia ?Complications:         No immediate complications. ?Procedure:             Pre-Anesthesia Assessment: ?                       - Prior to the procedure, a History and Physical was  ?                       performed, and patient medications and allergies were  ?                       reviewed. The patient's tolerance of previous  ?                       anesthesia was also reviewed. The risks and benefits  ?                       of the procedure and the sedation options and risks  ?                       were discussed with the patient. All questions were  ?                       answered, and informed consent was obtained. Prior  ?                       Anticoagulants: The patient has taken no previous  ?                       anticoagulant or antiplatelet agents. ASA Grade  ?                       Assessment: II - A patient with mild systemic disease.  ?                       After reviewing the risks and benefits, the patient  ?                       was deemed in satisfactory condition to undergo the  ?                       procedure. ?                       After obtaining informed consent, the colonoscope was  ?  passed under direct vision. Throughout the procedure,  ?                       the patient's blood pressure, pulse, and oxygen  ?                       saturations were monitored continuously. The  ?                       Colonoscope was  introduced through the anus and  ?                       advanced to the the cecum, identified by appendiceal  ?                       orifice and ileocecal valve. The colonoscopy was  ?                       performed without difficulty. The patient tolerated  ?                       the procedure well. The quality of the bowel  ?                       preparation was excellent. ?Findings: ?     The perianal and digital rectal examinations were normal. ?     Non-bleeding internal hemorrhoids were found during retroflexion. The  ?     hemorrhoids were Grade II (internal hemorrhoids that prolapse but reduce  ?     spontaneously). ?Impression:            - Non-bleeding internal hemorrhoids. ?                       - No specimens collected. ?Recommendation:        - Discharge patient to home. ?                       - Resume previous diet. ?                       - Continue present medications. ?                       - Repeat colonoscopy in 10 years for screening  ?                       purposes. ?Procedure Code(s):     --- Professional --- ?                       [id number], Colonoscopy, flexible; diagnostic, including  ?                       collection of specimen(s) by brushing or washing, when  ?                       performed (separate procedure) ?Diagnosis Code(s):     --- Professional --- ?                       Z12.11, Encounter for screening for  malignant neoplasm  ?                       of colon ?CPT copyright 2019 American Medical Association. All rights reserved. ?The codes documented in this report are preliminary and upon coder review may  ?be revised to meet current compliance requirements. ?Lucilla Lame MD, MD ?08/19/2021 9:27:36 AM ?This report has been signed electronically. ?Number of Addenda: 0 ?Note Initiated On: 08/19/2021 8:51 AM ?Scope Withdrawal Time: 0 hours 9 minutes 16 seconds  ?Total Procedure Duration: 0 hours 22 minutes 42 seconds  ?Estimated Blood Loss:  Estimated blood loss: none. ?      [facility] ?

## 2021-08-19 NOTE — Anesthesia Postprocedure Evaluation (Signed)
Anesthesia Post Note ? ?Patient: Jennifer Olsen ? ?Procedure(s) Performed: COLONOSCOPY WITH PROPOFOL ? ? ?  ?Patient location during evaluation: PACU ?Anesthesia Type: General ?Level of consciousness: awake and alert ?Pain management: pain level controlled ?Vital Signs Assessment: post-procedure vital signs reviewed and stable ?Respiratory status: spontaneous breathing, nonlabored ventilation, respiratory function stable and patient connected to nasal cannula oxygen ?Cardiovascular status: blood pressure returned to baseline and stable ?Postop Assessment: no apparent nausea or vomiting ?Anesthetic complications: no ? ? ?No notable events documented. ? ?Trecia Rogers ? ? ? ? ? ?

## 2021-08-19 NOTE — Transfer of Care (Signed)
Immediate Anesthesia Transfer of Care Note ? ?Patient: Jennifer Olsen ? ?Procedure(s) Performed: COLONOSCOPY WITH PROPOFOL ? ?Patient Location: PACU ? ?Anesthesia Type: General ? ?Level of Consciousness: awake, alert  and patient cooperative ? ?Airway and Oxygen Therapy: Patient Spontanous Breathing and Patient connected to supplemental oxygen ? ?Post-op Assessment: Post-op Vital signs reviewed, Patient's Cardiovascular Status Stable, Respiratory Function Stable, Patent Airway and No signs of Nausea or vomiting ? ?Post-op Vital Signs: Reviewed and stable ? ?Complications: No notable events documented. ? ?

## 2021-08-19 NOTE — Anesthesia Preprocedure Evaluation (Signed)
Anesthesia Evaluation  ?Patient identified by MRN, date of birth, ID band ?Patient awake ? ? ? ?Reviewed: ?Allergy & Precautions, H&P , NPO status , Patient's Chart, lab work & pertinent test results, reviewed documented beta blocker date and time  ? ?Airway ?Mallampati: II ? ?TM Distance: >3 FB ?Neck ROM: full ? ? ? Dental ?no notable dental hx. ? ?  ?Pulmonary ?neg pulmonary ROS,  ?  ?Pulmonary exam normal ?breath sounds clear to auscultation ? ? ? ? ? ? Cardiovascular ?Exercise Tolerance: Good ?hypertension, Normal cardiovascular exam ?Rhythm:regular Rate:Normal ? ? ?  ?Neuro/Psych ?negative neurological ROS ? negative psych ROS  ? GI/Hepatic ?negative GI ROS, Neg liver ROS,   ?Endo/Other  ?negative endocrine ROS ? Renal/GU ?negative Renal ROS  ?negative genitourinary ?  ?Musculoskeletal ? ? Abdominal ?  ?Peds ? Hematology ?negative hematology ROS ?(+)   ?Anesthesia Other Findings ? ? Reproductive/Obstetrics ?negative OB ROS ? ?  ? ? ? ? ? ? ? ? ? ? ? ? ? ?  ?  ? ? ? ? ? ? ? ? ?Anesthesia Physical ?Anesthesia Plan ? ?ASA: 2 ? ?Anesthesia Plan: General  ? ?Post-op Pain Management:   ? ?Induction:  ? ?PONV Risk Score and Plan:  ? ?Airway Management Planned:  ? ?Additional Equipment:  ? ?Intra-op Plan:  ? ?Post-operative Plan:  ? ?Informed Consent: I have reviewed the patients History and Physical, chart, labs and discussed the procedure including the risks, benefits and alternatives for the proposed anesthesia with the patient or authorized representative who has indicated his/her understanding and acceptance.  ? ? ? ?Dental Advisory Given ? ?Plan Discussed with: CRNA and Anesthesiologist ? ?Anesthesia Plan Comments:   ? ? ? ? ? ? ?Anesthesia Quick Evaluation ? ?

## 2021-08-20 ENCOUNTER — Encounter: Payer: Self-pay | Admitting: Gastroenterology

## 2021-08-22 DIAGNOSIS — N83202 Unspecified ovarian cyst, left side: Secondary | ICD-10-CM | POA: Diagnosis not present

## 2021-08-22 DIAGNOSIS — N83201 Unspecified ovarian cyst, right side: Secondary | ICD-10-CM | POA: Diagnosis not present

## 2021-08-23 ENCOUNTER — Encounter: Payer: Self-pay | Admitting: Internal Medicine

## 2021-08-23 DIAGNOSIS — R928 Other abnormal and inconclusive findings on diagnostic imaging of breast: Secondary | ICD-10-CM

## 2021-08-23 NOTE — Telephone Encounter (Signed)
MRI breast ordered

## 2021-08-27 ENCOUNTER — Other Ambulatory Visit: Payer: Self-pay

## 2021-08-27 ENCOUNTER — Other Ambulatory Visit: Payer: Self-pay | Admitting: Internal Medicine

## 2021-08-27 ENCOUNTER — Encounter: Payer: Self-pay | Admitting: Gastroenterology

## 2021-08-27 ENCOUNTER — Ambulatory Visit: Payer: 59 | Admitting: Gastroenterology

## 2021-08-27 VITALS — BP 135/87 | HR 58 | Temp 98.1°F | Ht 67.5 in | Wt 152.0 lb

## 2021-08-27 DIAGNOSIS — K648 Other hemorrhoids: Secondary | ICD-10-CM

## 2021-08-27 DIAGNOSIS — N83202 Unspecified ovarian cyst, left side: Secondary | ICD-10-CM | POA: Insufficient documentation

## 2021-08-27 MED FILL — Atenolol Tab 50 MG: ORAL | 90 days supply | Qty: 90 | Fill #0 | Status: AC

## 2021-08-27 MED FILL — Triamterene & Hydrochlorothiazide Tab 37.5-25 MG: ORAL | 90 days supply | Qty: 90 | Fill #0 | Status: AC

## 2021-08-27 NOTE — Telephone Encounter (Signed)
Pt need refill on atenolol,  hydrocortisone and  hydrochlorothiazide sent to [facility] ?

## 2021-08-27 NOTE — Progress Notes (Signed)
Patient follow-ups today for banding of hemorrhoids ? ? ? ?Summary of history : ?She is here today to discuss about hemorrhoidal banding.  She was seen by Dr. Allen Norris in the past for hemorrhoid issues.  She has had a colonoscopy in April 2023 she was found to have nonbleeding internal hemorrhoids during retroflexion grade 2.  She is here today to for banding.  Failed conservative management. ? ?She denies any bleeding from the hemorrhoids but complains of significant perianal itching ? ? ?Digital rectal exam performed in the presence of a chaperone. ?External anal findings: Prolapsing second-degree hemorrhoids ?Internal findings: , No masses, no blood on glove noticed. ? ? ? ?PROCEDURE NOTE: ?The patient presents with symptomatic grade 2 hemorrhoids, unresponsive to maximal medical therapy, requesting rubber band ligation of his/her hemorrhoidal disease.  All risks, benefits and alternative forms of therapy were described and informed consent was obtained. ? ?In the Left Lateral Decubitus position (if anoscopy is performed) anoscopic examination revealed grade 2 hemorrhoids in the all position(s).  ? ?The decision was made to band the RA internal hemorrhoid, and the [place] O?Regan System was used to perform band ligation without complication.  Digital anorectal examination was then performed to assure proper positioning of the band, and to adjust the banded tissue as required.  The patient was discharged home without pain or other issues.  Dietary and behavioral recommendations were given and (if necessary - prescriptions were given), along with follow-up instructions.  The patient will return 4 weeks for follow-up and possible additional banding as required. ? ?No complications were encountered and the patient tolerated the procedure well. ? ? ?Plan: ? ?Avoid constipation.  Commence on stool softeners if not already on ? ?Follow-up: 4 weeks ? ?Dr Jonathon Bellows MD,MRCP [facility]) ?Gastroenterology/Hepatology ?Pager:  [phone] ? ? ?

## 2021-08-28 ENCOUNTER — Other Ambulatory Visit: Payer: Self-pay

## 2021-08-28 ENCOUNTER — Inpatient Hospital Stay: Payer: 59 | Attending: Obstetrics and Gynecology | Admitting: Obstetrics and Gynecology

## 2021-08-28 VITALS — BP 113/84 | HR 99 | Temp 97.6°F | Resp 16 | Wt 155.0 lb

## 2021-08-28 DIAGNOSIS — D259 Leiomyoma of uterus, unspecified: Secondary | ICD-10-CM | POA: Insufficient documentation

## 2021-08-28 DIAGNOSIS — N949 Unspecified condition associated with female genital organs and menstrual cycle: Secondary | ICD-10-CM

## 2021-08-28 DIAGNOSIS — N946 Dysmenorrhea, unspecified: Secondary | ICD-10-CM | POA: Diagnosis not present

## 2021-08-28 DIAGNOSIS — N83202 Unspecified ovarian cyst, left side: Secondary | ICD-10-CM | POA: Diagnosis not present

## 2021-08-28 NOTE — Progress Notes (Signed)
Follow up from ultrasound ?

## 2021-08-28 NOTE — Progress Notes (Signed)
Pt in for follow up from ultrasound.  ?

## 2021-08-28 NOTE — Progress Notes (Signed)
Gynecologic Oncology Consult Visit  ? ?Referring Provider: Prentice Docker, MD ? ?Chief Concern: complex cystic left ovarian mass, elevated CA125 ? ?Subjective:  ?Jennifer Olsen is a 46 y.o. P15 female who is seen in consultation from Dr. Glennon Mac cystic ovarian mass diagnosed by ultrasound obtained to assess dysmenorrhea and increased uterine bleeding with menses. ? ?Patient returns for follow up after initial evaluation 6 weeks ago.  No change in her condition. ? ?07/17/21: CA125 = 167,  HE4 = 53.3  ?ROMA = 9.8% ? ?Pelvic US 08/22/21 ?The uterus is anteverted and measures 10.45 x 5.1 x 9 cm.  ?Echo texture is heterogenous with evidence of focal masses.  ?Within the uterus are multiple suspected fibroids measuring:  ?Fibroid 1: measures 4.9 x 2.7 x 4.46 cm, posterior  ?Fibroid 2:measures 2.4 x 1.7 x 2.2 cm, right anterior  ?Fibroid 3: measures 2.8 x 2.34 x 2.6 cm, left lateral  ?The Endometrium measures 6.3 mm (contians fluid measuring 15.3 mm)  ? ?Right Ovary measures 7.3 x 2.7 x 2.9 cm.  ? Three cysts noted: 1) 5.05 x 2 x 2.3 cm (hemorrhagic), 2) 1.5 x 1.1  ?x 1.2 cm (simple), 3) 1.9 x 1.2 x 1.6 cm (complex)  ? ?Left Ovary measures 9.5 x 8.5 x 8 cm.  ? Cyst noted: measures 9.6 x 7 x 7.5 cm (volume 501.5 cm^3)  ?Survey of the adnexa demonstrates no adnexal masses.  ?There is small free fluid in the cul de sac.  ? ?Impression:  ?1. Fibroid uterus with better defined fibroid measurements than in last  ?study, which only specified two fibroids.  ?2. Irregular fluid within endometrium, appears unchanged compared to prior  ?study.  Likely a small collection of fluid/blood/debris related to  ?endometrial ablation.  ?3. Right ovary with three cysts noted.  This is a change from the prior  ?study, which only measured 2.7 by 2.2 x 2.9 cm.  Cyst appears hemorrhagic,  ?though significant growth in size of undetermined significance.  ?4. Left ovary with complex cyst with interval increase in size from 367.2  ?cm^3 to now 501.45  cm^3.    ? ?Gyn History ?NSVD x 3.  She has a h/o of abnormal uterine bleeding and had an endometrial ablation at the age 22 yo. She still has monthly periods that are getting heavier. She presented to Dr. Nicki Reaper who ordered a pelvic ultrasound for evaluation.  ? ?06/20/2021 Pelvic US ?FINDINGS: ?Uterus: Measurements: 9.3 x 6.5 x 7.9 cm = volume: 250 mL. Multiple uterine masses probably fibroids. The 2 most distinct masses are measured. Right fundal submucosal mass measuring 2.9 x 1.7 x 2.4 cm. Left lower uterine segment posterior subserosal mass measuring 4.1 x 3.1 ?x 3.7 cm. ?  ?Endometrium: Thickness: 15.4 mm. Abnormal complex fluid within the endometrial ?canal measuring up to 15.4 mm. ?  ?Right ovary: Measurements: 4.3 x 2.7 x 1.6 cm = volume: 9.2 mL. Complex cyst ?measuring 2.7 by 2.2 x 2.9 cm suggestive of hemorrhagic cyst. ?  ?Left ovary: Measurements: 8.6 x 6.3 x 7.4 cm = volume: 207.9 mL. Complex left ?adnexal cystic mass measuring 10.2 x 4.8 x 7.5 cm. ?  ?Trace free fluid. ?  ?IMPRESSION: ?1. Large complex left adnexal cystic mass measuring up to 10.2 cm, versus dilated fallopian tube complicated by hemorrhage or infected material. Surgical consultation is recommended. ?2. Abnormal complex fluid distending the endometrial canal, raising concern for distal obstructing process. ?3. 2.9 cm possible hemorrhagic cyst in the right ovary. Recommend 6-12 week  sonographic follow-up for this finding ?4. Trace free fluid in the pelvis. ?5. Uterine fibroids ? ?Last pap smear 07/2019: NILIM, HPV negative ? ?See by [facility] for second opinion 07/17/21 with plan to check tumor markers and repeat US in 6 weeks. ? ?Problem List: ?Patient Active Problem List  ? Diagnosis Date Noted  ? Left ovarian cyst 08/27/2021  ? Colon cancer screening   ? Breast implant status 08/13/2021  ? Thyroid fullness 08/09/2019  ? Routine general medical examination at a health care facility 10/20/2012  ? Hypertension 10/09/2011  ? Hemorrhoids  10/09/2011  ? Contraception 10/09/2011  ? ? ?Past Medical History: ?Past Medical History:  ?Diagnosis Date  ? Hypertension   ? s/p renal artery doppler which was normal  ? Persistent headaches   ? Thyroid nodule   ? ? ?Past Surgical History: ?Past Surgical History:  ?Procedure Laterality Date  ? AUGMENTATION MAMMAPLASTY Bilateral 05/2015  ? saline  ? COLONOSCOPY WITH PROPOFOL N/A 08/19/2021  ? Procedure: COLONOSCOPY WITH PROPOFOL;  Surgeon: Lucilla Lame, MD;  Location: [place];  Service: Endoscopy;  Laterality: N/A;  ? FOOT SURGERY    ? bilateral, bunions, Dr. Elvina Mattes  ? [place] ABLATION  2010  ? Dr. Amalia Hailey  ? WRIST SURGERY  2011  ? left wrist, fracture, Dr. Mauri Pole  ? ? ?Past Gynecologic History:  ?Menarche: 12 ?Menstrual details: Lasts 3 - 5 days; long h/o dysmenorrhea ?Menses regular: yes ?History of OCP/HRT use: she declines OCP her mother had an aneurysm associated with estrogen ?History of Abnormal pap: no,  ?Last pap: 07/2019: NILIM, HPV negative ?History of STDs: reviewed  ?Sexually active: yes ? ?Family History: ?Family History  ?Problem Relation Age of Onset  ? Aneurysm Mother   ? Heart disease Mother   ? Breast cancer Neg Hx   ? ? ?Social History: ?Social History  ? ?Socioeconomic History  ? Marital status: Married  ?  Spouse name: Not on file  ? Number of children: Not on file  ? Years of education: Not on file  ? Highest education level: Not on file  ?Occupational History  ? Not on file  ?Tobacco Use  ? Smoking status: Never  ? Smokeless tobacco: Never  ?Vaping Use  ? Vaping Use: Never used  ?Substance and Sexual Activity  ? Alcohol use: Yes  ?  Comment: Occasional  ? Drug use: No  ? Sexual activity: Not on file  ?Other Topics Concern  ? Not on file  ?Social History Narrative  ? Lives in [place] with 3 children 16, 12, 5YO.  Dog in home.  ? Recently married.  ? 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 Diet - regular  ? Exercise - walking 20-27mn daily  ? ?Social Determinants of Health   ? ?Financial Resource Strain: Not on file  ?Food Insecurity: Not on file  ?Transportation Needs: Not on file  ?Physical Activity: Not on file  ?Stress: Not on file  ?Social Connections: Not on file  ?Intimate Partner Violence: Not on file  ? ? ?Allergies: ?No Known Allergies ? ?Current Medications: ?Current Outpatient Medications  ?Medication Sig Dispense Refill  ? atenolol (TENORMIN) 50 MG tablet TAKE 1 TABLET (50 MG TOTAL) BY MOUTH DAILY. 90 tablet 3  ? Fexofenadine HCl (ALLEGRA ALLERGY PO) Take by mouth as needed.    ? hydrochlorothiazide (MICROZIDE) 12.5 MG capsule Take 12.5 mg by mouth daily.    ? hydrocortisone 2.5 % cream Apply to affected area as directed (Patient taking differently: as  needed. Apply to affected area as directed) 28 g 3  ? Multiple Vitamin (MULTIVITAMIN) tablet Take 1 tablet by mouth daily.    ? triamterene-hydrochlorothiazide (MAXZIDE-25) 37.5-25 MG tablet TAKE 1 TABLET BY MOUTH DAILY. (Patient not taking: Reported on 08/28/2021) 90 tablet 3  ? ?No current facility-administered medications for this visit.  ? ? ?Review of Systems ?General: negative for fevers, changes in weight or night sweats ?Skin: negative for changes in moles or sores or rash ?Eyes: negative for changes in vision ?HEENT: negative for change in hearing, tinnitus, voice changes ?Pulmonary: negative for dyspnea, orthopnea, productive cough, wheezing ?Cardiac: negative for palpitations, pain ?Gastrointestinal: negative for nausea, vomiting, constipation, diarrhea, hematemesis, hematochezia ?Genitourinary/Sexual: negative for dysuria, retention, hematuria, incontinence ?Ob/Gyn:  as per HPI ?Musculoskeletal: negative for pain, joint pain, back pain ?Hematology: negative for easy bruising, abnormal bleeding ?Neurologic/Psych: negative for headaches, seizures, paralysis, weakness, numbness  ? ?Objective:  ?Physical Examination:  ?BP 113/84 (BP Location: Left Arm, Patient Position: Sitting)   Pulse 99   Resp 16   Wt 155 lb  (70.3 kg)   SpO2 99%   BMI 23.92 kg/m?  ? ?ECOG Performance Status: 0 - Asymptomatic ? ?Exam per prior visit in 3/23 ?GENERAL: Patient is a well appearing female in no acute distress ?HEENT:  PERRL, neck supple with midline t

## 2021-08-28 NOTE — Patient Instructions (Signed)
DIVISION OF GYNECOLOGIC ONCOLOGY BOWEL PREP ?  ?The following instructions are extremely important to prepare for your surgery. ?Please follow them carefully ?  ?Step 1: Liquid Diet Instructions ? ?            Clear Liquid Diet for GYN Oncology Patients Day Before Surgery ?The day before your scheduled surgery DO NOT EAT any solid foods.  We do want you to drink enough liquids, but NO MILK products.  We do not want you to be dehydrated.  ?Clear liquids are defined as no milk products and no pieces of any solid food. Drink at least 64 oz. of fluid. ? ?The following are all approved for you to drink the day before you surgery. ?Chicken, Beef or Vegetable Broth (bouillon or consomm?) - NO BROTH AFTER MIDNIGHT ?Plain Jello  (no fruit) ?Water ?Strained lemonade or fruit punch ?Gatorade (any flavor) ?CLEAR Ensure or Boost Breeze ?Fruit juices without pulp, such as apple, grape, or cranberry juice ?Clear sodas - NO SODA AFTER MIDNIGHT ?Ice Pops without bits of fruit or fruit pulp ?Honey ?Tea or coffee without milk or cream ?                Any foods not on the above list should be avoided                      ?                                                                    ?   ?Step 2: Laxatives         ?  ?The evening before surgery:   Time: around 5pm ?  ?Follow these instructions carefully. ?  ?Administer 1 Dulcolax suppository according to manufacturer instructions on the box. You will need to purchase this laxative at a pharmacy or grocery store.  ?  ?Individual responses to laxatives vary; this prep may cause multiple bowel movements. It often works in 30 minutes and may take as long as 3 hours. Stay near an available bathroom.  ?  ?It is important to stay hydrated. Ensure you are still drinking clear liquids. ?  ?  ?IMPORTANT: FOR YOUR SAFETY, WE WILL HAVE TO CANCEL YOUR SURGERY IF YOU DO NOT FOLLOW THESE INSTRUCTIONS. ?  ?Do not eat anything after midnight (including gum or candy) prior to your  surgery. ?Avoid drinking carbonated beverages after midnight. ?You can have clear liquids up until one hour before you arrive at the hospital. ?Nothing by mouth? means no liquids, gum, candy, etc for one hour before your arrival time. ?  ?  ? Laparoscopy ?Laparoscopy is a procedure to diagnose diseases in the abdomen. During the procedure, a thin, lighted, pencil-sized instrument called a laparoscope is inserted into the abdomen through an incision. The laparoscope allows your health care provider to look at the organs inside your body. ?LET [facility] CARE PROVIDER KNOW ABOUT: ?Any allergies you have. ?All medicines you are taking, including vitamins, herbs, eye drops, creams, and over-the-counter medicines. ?Previous problems you or members of your family have had with the use of anesthetics. ?Any blood disorders you have. ?Previous surgeries you have had. ?Medical conditions you have. ?RISKS AND COMPLICATIONS  ?Generally, this  is a safe procedure. However, problems can occur, which may include: ?Infection. ?Bleeding. ?Damage to other organs. ?Allergic reaction to the anesthetics used during the procedure. ?BEFORE THE PROCEDURE ?Do not eat or drink anything after midnight on the night before the procedure or as directed by your health care provider. ?Ask your health care provider about: ?Changing or stopping your regular medicines. ?Taking medicines such as aspirin and ibuprofen. These medicines can thin your blood. Do not take these medicines before your procedure if your health care provider instructs you not to. ?Plan to have someone take you home after the procedure. ?PROCEDURE ?You may be given a medicine to help you relax (sedative). ?You will be given a medicine to make you sleep (general anesthetic). ?Your abdomen will be inflated with a gas. This will make your organs easier to see. ?Small incisions will be made in your abdomen. ?A laparoscope and other small instruments will be inserted into the abdomen  through the incisions. ?A tissue sample may be removed from an organ in the abdomen for examination. ?The instruments will be removed from the abdomen. ?The gas will be released. ?The incisions will be closed with stitches (sutures). ?AFTER THE PROCEDURE  ?Your blood pressure, heart rate, breathing rate, and blood oxygen level will be monitored often until the medicines you were given have worn off. ?  ?This information is not intended to replace advice given to you by your health care provider. Make sure you discuss any questions you have with your health care provider. ? ?              ?  ?  ?                         ?Bowel Symptoms After Surgery ?After gynecologic surgery, women often have temporary changes in bowel function (constipation and gas pain).  Following are tips to help prevent and treat common bowel problems.  It also tells you when to call the doctor.  This is important because some symptoms might be a sign of a more serious bowel problem such as obstruction (bowel blockage).  These problems are rare but can happen after gynecologic surgery. ?  ?Besides surgery, what can temporarily affect bowel function? ?1. Dietary changes   2. Decreased physical activity   3.Antibiotics   4. Pain medication ?  ?How can I prevent constipation (three days or more without a stool)? ?Include fiber in your diet: whole grains, raw or dried fruits & vegetables, prunes, prune/pear juiceDrink at least 8 glasses of liquid (preferably water) every day ?Avoid: ?Gas forming foods such as broccoli, beans, peas, salads, cabbage, sweet potatoes ?Greasy, fatty, or fried foods ?Activity helps bowel function return to normal, walk around the house at least 3-4 times each day for 15 minutes or longer, if tolerated.  Rocking in a rocking chair is preferable to sitting still. ?Stool softeners: these are not laxatives, but serve to soften the stool to avoid straining.  Take 2-4 times a day until normal bowel function returns  ?        Examples: Colace or generic equivalent (Docusate) ?Bulk laxatives: provide a concentrated source of fiber.  They do not stimulate the bowel.  Take 1-2 times each day until normal bowel function return. ?             Examples: Citrucel, Metamucil, Fiberal, Fibercon ?  ?What can I take for ?Gas Pains?? ?Simethicone (Mylicon, Gas-X, Maalox-Gas, Mylanta-Gas) take 3-4  times a day ?Maalox Regular - take 3-4 times a day ?Mylanta Regular - take 3-4 times a day ?  ?What can I take if I become constipated? ?Start with stool softeners and add additional laxatives below as needed to have a bowel movement every 1-2 days  ?Stool softeners 1-2 tablets, 2 times a day ?Senokot 1-2 tablets, 1-2 times a day ?Glycerin suppository can soften hard stool take once a day ?Bisacodyl suppository once a day  ?Milk of Magnesia 30 mL 1-2 times a day ?Fleets or tap water enema  ?  ?What can I do for nausea?  ?Limit most solid foods for 24-48 hours ?Continue eating small frequent amounts of liquids and/or bland soft foods ?Toast, crackers, cooked cereal (grits, cream of wheat, rice) ?Benadryl: a mild anti-nausea medicine can be obtained without a prescription. May cause drowsiness, especially if taken with narcotic pain medicines ?Contact provider for prescription nausea medication   ?  ?What can I do, or take for diarrhea (more than five loose stools per day)? ?Drink plenty of clear fluids to prevent dehydration ?May take Kaopectate, Pepto-Bismol, Imodium, or probiotics for 1-2 days ?Anusol or Preparation-H can be helpful for hemorrhoids and irritated tissue around anus ?  ?When should I call the doctor? ?            CONSTIPATION:  ?Not relieved after three days following the above program ?VOMITING: ?That contains blood, ?coffee ground? material ?More the three times/hour and unable to keep down nausea medication for more than eight hours ?With dry mouth, dark or strong urine, feeling light-headed, dizzy, or confused ?With severe abdominal pain  or bloating for more than 24 hours ?DIARRHEA: ?That continues for more then 24-48 hours despite treatment ?That contains blood or tarry material ?With dry mouth, dark or strong urine, feeling light~headed,

## 2021-08-29 ENCOUNTER — Other Ambulatory Visit: Payer: Self-pay | Admitting: Internal Medicine

## 2021-08-29 DIAGNOSIS — R928 Other abnormal and inconclusive findings on diagnostic imaging of breast: Secondary | ICD-10-CM

## 2021-08-29 NOTE — Progress Notes (Signed)
Order placed for mri 

## 2021-08-30 ENCOUNTER — Telehealth: Payer: Self-pay | Admitting: *Deleted

## 2021-08-30 NOTE — Telephone Encounter (Signed)
RN called and notified patient of her pre-admit testing appointment on May 2nd between 1-5. [facility] This is pre-admit testing for her surgery on may 10th per Beckey Rutter NP. Pt verbalized understanding.  ?

## 2021-09-02 ENCOUNTER — Other Ambulatory Visit: Payer: Self-pay | Admitting: Nurse Practitioner

## 2021-09-02 ENCOUNTER — Inpatient Hospital Stay: Payer: 59

## 2021-09-02 DIAGNOSIS — N9489 Other specified conditions associated with female genital organs and menstrual cycle: Secondary | ICD-10-CM

## 2021-09-02 NOTE — Progress Notes (Signed)
Spoke to patient. Dr. Fransisca Connors recommends ct scan a/p and repeat ca 125 in anticipation of surgery. Patient agreeable. Will order and get scheduled.  ?

## 2021-09-02 NOTE — H&P (View-Only) (Signed)
Spoke to patient. Dr. Fransisca Connors recommends ct scan a/p and repeat ca 125 in anticipation of surgery. Patient agreeable. Will order and get scheduled.  ?

## 2021-09-09 ENCOUNTER — Ambulatory Visit: Payer: 59

## 2021-09-10 ENCOUNTER — Other Ambulatory Visit: Payer: Self-pay

## 2021-09-10 ENCOUNTER — Encounter
Admission: RE | Admit: 2021-09-10 | Discharge: 2021-09-10 | Disposition: A | Discharge status: Home / Self Care | Payer: 59 | Source: Ambulatory Visit | Attending: Obstetrics and Gynecology | Admitting: Obstetrics and Gynecology

## 2021-09-10 ENCOUNTER — Ambulatory Visit
Admission: RE | Admit: 2021-09-10 | Discharge: 2021-09-10 | Disposition: A | Discharge status: Home / Self Care | Payer: 59 | Source: Ambulatory Visit | Attending: Nurse Practitioner | Admitting: Nurse Practitioner

## 2021-09-10 ENCOUNTER — Inpatient Hospital Stay: Payer: 59 | Attending: Obstetrics and Gynecology

## 2021-09-10 DIAGNOSIS — N83202 Unspecified ovarian cyst, left side: Secondary | ICD-10-CM | POA: Insufficient documentation

## 2021-09-10 DIAGNOSIS — N9489 Other specified conditions associated with female genital organs and menstrual cycle: Secondary | ICD-10-CM | POA: Insufficient documentation

## 2021-09-10 DIAGNOSIS — Z01818 Encounter for other preprocedural examination: Secondary | ICD-10-CM | POA: Insufficient documentation

## 2021-09-10 DIAGNOSIS — N858 Other specified noninflammatory disorders of uterus: Secondary | ICD-10-CM | POA: Diagnosis not present

## 2021-09-10 DIAGNOSIS — N7011 Chronic salpingitis: Secondary | ICD-10-CM | POA: Diagnosis not present

## 2021-09-10 DIAGNOSIS — D259 Leiomyoma of uterus, unspecified: Secondary | ICD-10-CM | POA: Diagnosis not present

## 2021-09-10 LAB — URINALYSIS, ROUTINE W REFLEX MICROSCOPIC
Bilirubin Urine: NEGATIVE
Glucose, UA: NEGATIVE mg/dL
Hgb urine dipstick: NEGATIVE
Ketones, ur: NEGATIVE mg/dL
Leukocytes,Ua: NEGATIVE
Nitrite: NEGATIVE
Protein, ur: NEGATIVE mg/dL
Specific Gravity, Urine: 1.005 (ref 1.005–1.030)
pH: 5 (ref 5.0–8.0)

## 2021-09-10 LAB — TYPE AND SCREEN
ABO/RH(D): O POS
Antibody Screen: NEGATIVE

## 2021-09-10 IMAGING — CT CT ABD-PELV W/ CM
2 of 5 series · 15 of 46 positions shown, 17 images · IV contrast (agent unspecified)
Comparison: Ultrasound [DATE]

CLINICAL DATA: Adnexal mass, malignancy suspected Evaluate
ovarian/adnexal mass in preparation for surgery

EXAM:
CT ABDOMEN AND PELVIS WITH CONTRAST
TECHNIQUE: Multidetector CT imaging of the abdomen and pelvis was performed
using the standard protocol following bolus administration of
intravenous contrast.

[Series 2: abd pelvis 5.00 · axial · 0.88mm/px · z∈[-1603,-1183]mm · 12 of 100 slices shown, 14 images]
[im 8/100  soft-tissue]
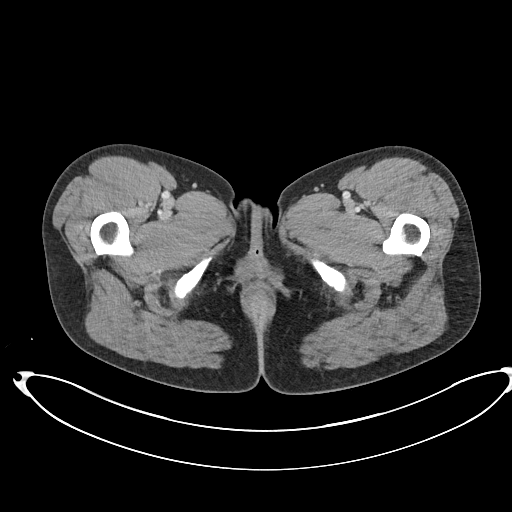
[im 8/100  bone]
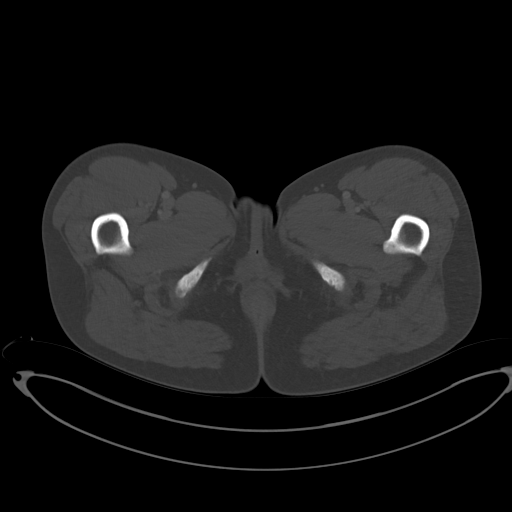
[im 16/100  soft-tissue]
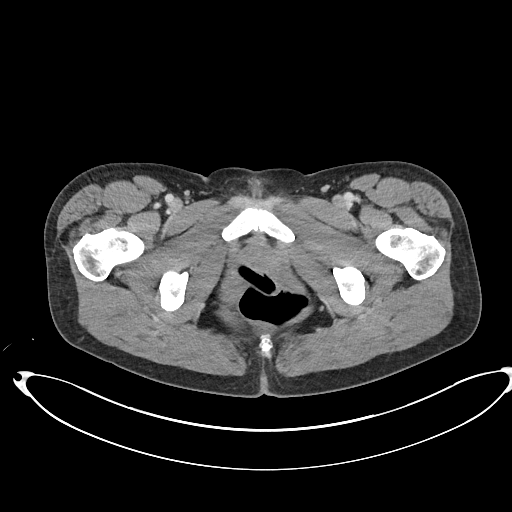
[im 23/100  soft-tissue]
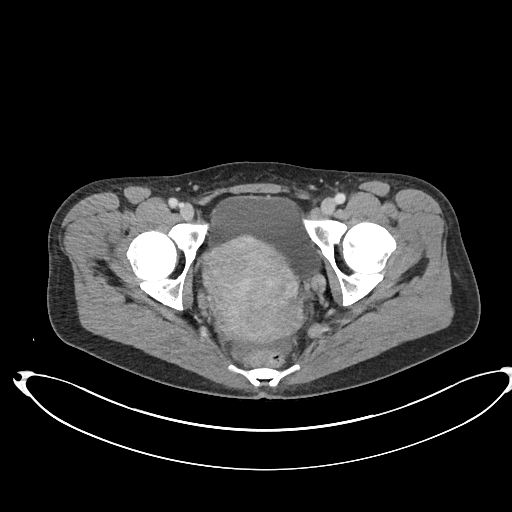
[im 31/100  soft-tissue]
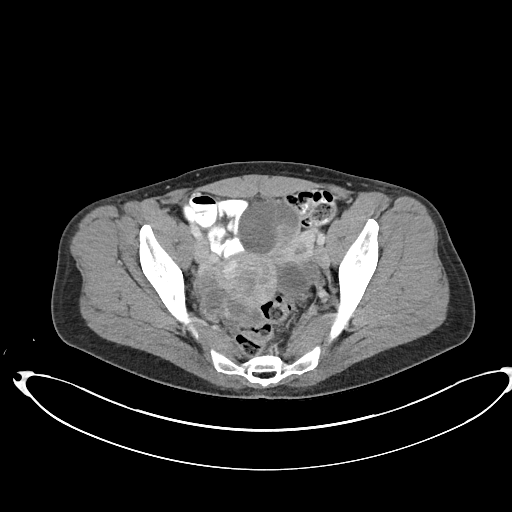
[im 39/100  soft-tissue]
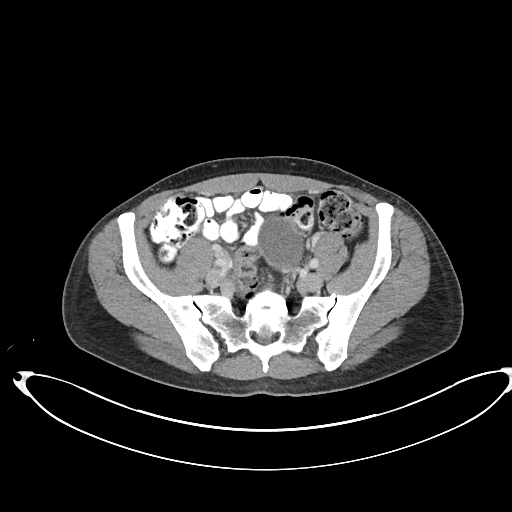
[im 46/100  soft-tissue]
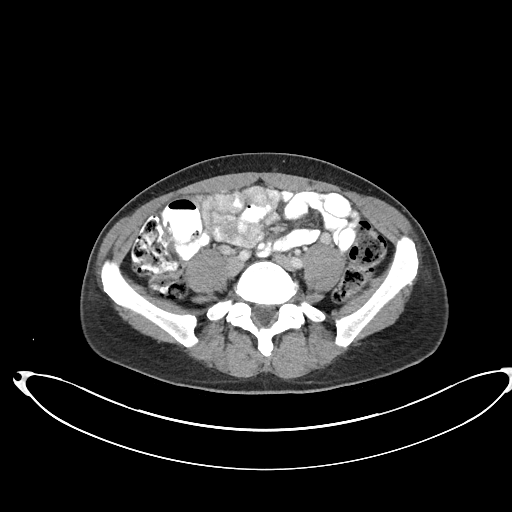
[im 54/100  soft-tissue]
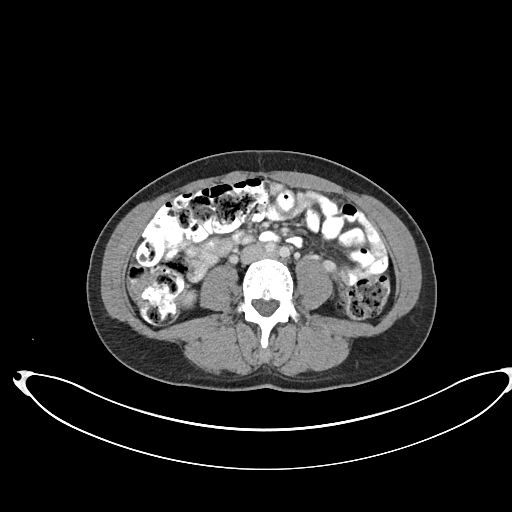
[im 61/100  soft-tissue]
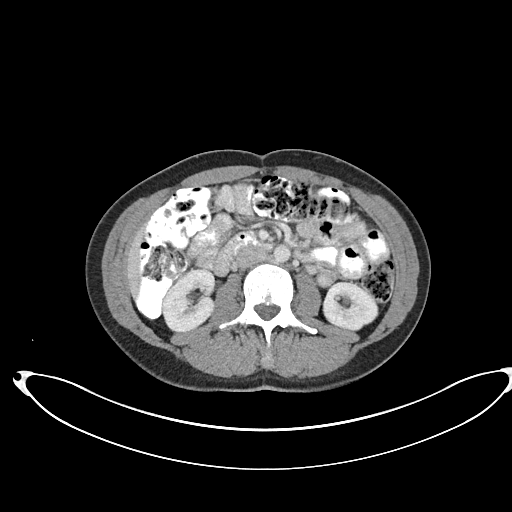
[im 69/100  soft-tissue]
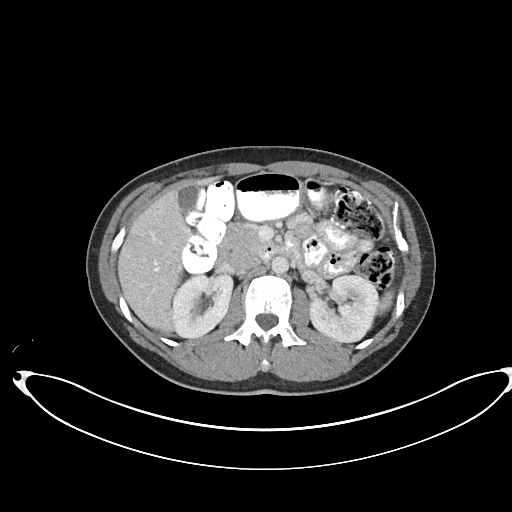
[im 69/100  bone]
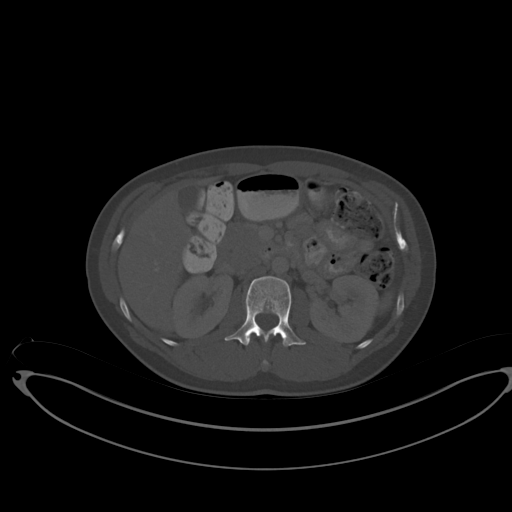
[im 77/100  soft-tissue]
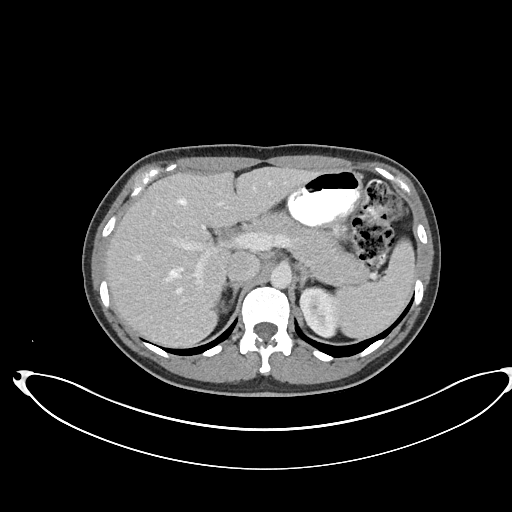
[im 84/100  soft-tissue]
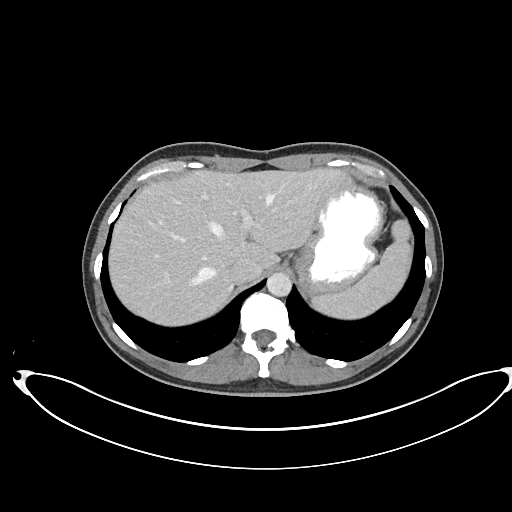
[im 92/100  soft-tissue]
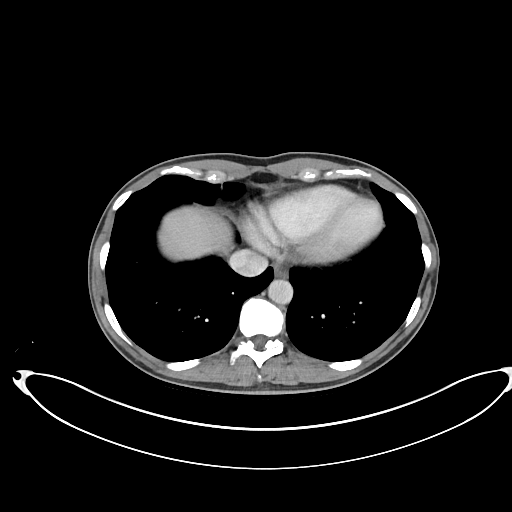

[Series 4: coronals abd pelvis 2.00 cor · coronal · 0.77mm/px · 3 of 124 slices shown]
[im 42/124  soft-tissue]
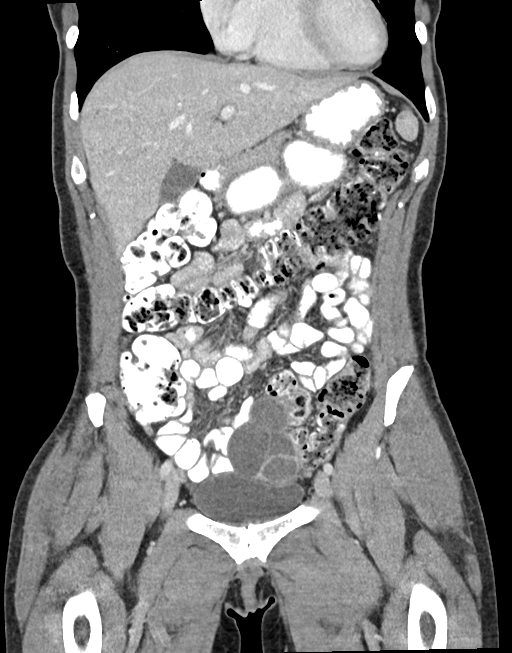
[im 55/124  soft-tissue]
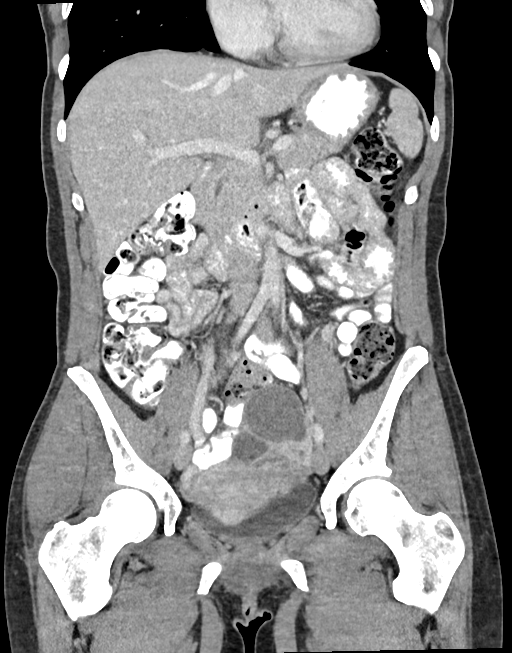
[im 69/124  soft-tissue]
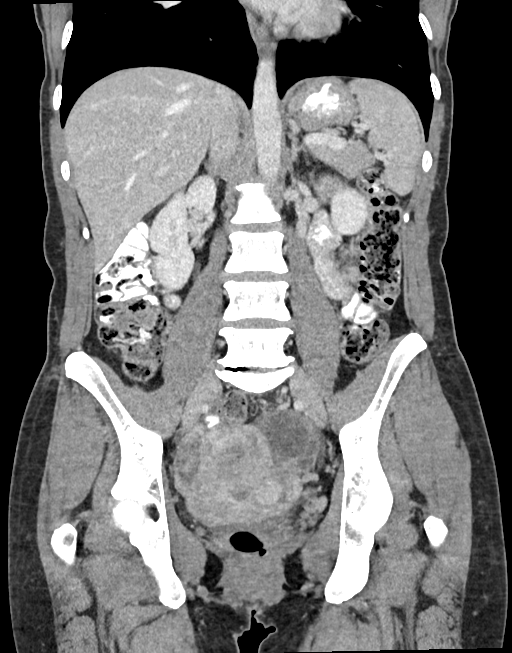

[15 of 46 positions shown; findings below may reference images not displayed]

RADIATION DOSE REDUCTION: This exam was performed according to the
departmental dose-optimization program which includes automated
exposure control, adjustment of the mA and/or kV according to
patient size and/or use of iterative reconstruction technique.

CONTRAST:  100mL OMNIPAQUE IOHEXOL 300 MG/ML  SOLN
FINDINGS: Lower chest: No acute findings

Hepatobiliary: No focal hepatic abnormality. Gallbladder
unremarkable.

Pancreas: No focal abnormality or ductal dilatation.

Spleen: No focal abnormality.  Normal size.

Adrenals/Urinary Tract: No adrenal abnormality. No focal renal
abnormality. No stones or hydronephrosis. Urinary bladder is
unremarkable.

Stomach/Bowel: Moderate stool burden. Stomach, large and small bowel
grossly unremarkable.

Vascular/Lymphatic: No evidence of aneurysm or adenopathy.

Reproductive: Multiple fibroids within the uterus. Fluid within the
endometrial canal.

Complex cystic ovarian mass within the left adnexa measuring 8.1 x
4.5 cm on axial imaging. This appears multi septated. It is
difficult to determine if this is a complex multi-septated cystic
ovarian mass or possibly hydrosalpinx. Dilated tubular structure
noted in the right adnexa suggesting hydrosalpinx on the right. This
is difficult to measure due to its serpiginous appearance but
measures approximately 4.9 x 2.2 cm on image 74.

Other: Trace free fluid in the pelvis.  No free air.

Musculoskeletal: No acute bony abnormality.
IMPRESSION: Bilateral cystic adnexal masses, left larger than right. The right
adnexal cystic mass appear serpiginous, favor hydrosalpinx. Left
adnexal cystic mass could also reflect hydrosalpinx although it is
difficult to exclude multi septated cystic ovarian mass.

Moderate stool burden in the colon.

## 2021-09-10 MED ORDER — FAMOTIDINE 20 MG PO TABS
20.0000 mg | ORAL_TABLET | Freq: Once | ORAL | Status: DC
  Filled 2021-09-10: qty 1

## 2021-09-10 MED ORDER — IOHEXOL 300 MG/ML  SOLN
100.0000 mL | Freq: Once | INTRAMUSCULAR | Status: AC | PRN
  Administered 2021-09-10: 100 mL via INTRAVENOUS

## 2021-09-10 NOTE — Patient Instructions (Addendum)
Your procedure is scheduled on: Wednesday 09/18/21 ?Report to the Registration Desk on the 1st floor of the [place]. ?To find out your arrival time, please call [phone] between 1PM - 3PM on: Tuesday 09/17/21 ?If your arrival time is 6:00 am, do not arrive prior to that time as the [facility] entrance doors do not open until 6:00 am. ? ?REMEMBER: ?Instructions that are not followed completely may result in serious medical risk, up to and including death; or upon the discretion of your surgeon and anesthesiologist your surgery may need to be rescheduled. ? ?Do not eat or drink after midnight the night before surgery.  ?No gum chewing, lozengers or hard candies. ? ?TAKE THESE MEDICATIONS THE MORNING OF SURGERY WITH A SIP OF WATER: None ? ?Per Dr. Blake Divine staff: you will need to pick up the Dulcolax suppository and use it at 5 pm the evening prior to the surgery ?Marland Kitchen  ?One week prior to surgery: ?Stop Anti-inflammatories (NSAIDS) such as Advil, Aleve, Ibuprofen, Motrin, Naproxen, Naprosyn and Aspirin based products such as Excedrin, Goodys Powder, BC Powder. ? ?Stop taking your Multiple Vitamin (MULTIVITAMIN) tablet, Psyllium (METAMUCIL PO), ANY OVER THE COUNTER supplements until after surgery. ? ?You may however, continue to take Tylenol if needed for pain up until the day of surgery. ? ?No Alcohol for 24 hours before or after surgery. ? ?No Smoking including e-cigarettes for 24 hours prior to surgery.  ?No chewable tobacco products for at least 6 hours prior to surgery.  ?No nicotine patches on the day of surgery. ? ?Do not use any "recreational" drugs for at least a week prior to your surgery.  ?Please be advised that the combination of cocaine and anesthesia may have negative outcomes, up to and including death. ?If you test positive for cocaine, your surgery will be cancelled. ? ?On the morning of surgery brush your teeth with toothpaste and water, you may rinse your mouth with mouthwash if you  wish. ?Do not swallow any toothpaste or mouthwash. ? ?Use CHG Soap  as directed on instruction sheet. ? ?Do not wear jewelry, make-up, hairpins, clips or nail polish. ? ?Do not wear lotions, powders, or perfumes.  ? ?Do not shave body from the neck down 48 hours prior to surgery just in case you cut yourself which could leave a site for infection.  ?Also, freshly shaved skin may become irritated if using the CHG soap. ? ?Do not bring valuables to the hospital. [facility] is not responsible for any missing/lost belongings or valuables.  ? ?Notify your doctor if there is any change in your medical condition (cold, fever, infection). ? ?Wear comfortable clothing (specific to your surgery type) to the hospital. ? ?After surgery, you can help prevent lung complications by doing breathing exercises.  ?Take deep breaths and cough every 1-2 hours.  ? ?When coughing or sneezing, hold a pillow firmly against your incision with both hands. This is called ?splinting.? Doing this helps protect your incision. It also decreases belly discomfort. ? ?If you are being discharged the day of surgery, you will not be allowed to drive home. ?You will need a responsible adult (18 years or older) to drive you home and stay with you that night.  ? ?If you are taking public transportation, you will need to have a responsible adult (18 years or older) with you. ?Please confirm with your physician that it is acceptable to use public transportation.  ? ?Please call the [facility] Dept. at [phone]  if you have any questions about these instructions. ? ?Surgery Visitation Policy: ? ?Patients undergoing a surgery or procedure may have two family members or support persons with them as long as the person is not COVID-19 positive or experiencing its symptoms.  ? ?Inpatient Visitation:   ? ?Visiting hours are 7 a.m. to 8 p.m. ?Up to four visitors are allowed at one time in a patient room, including children. The visitors may rotate  out with other people during the day. One designated support person (adult) may remain overnight.  ?

## 2021-09-11 LAB — CA 125: Cancer Antigen (CA) 125: 73.5 U/mL — ABNORMAL HIGH (ref 0.0–38.1)

## 2021-09-17 MED ORDER — ORAL CARE MOUTH RINSE: 15.0000 mL | Freq: Once | OROMUCOSAL | Status: AC

## 2021-09-17 MED ORDER — HEPARIN SODIUM (PORCINE) 5000 UNIT/ML IJ SOLN: 5000.0000 [IU] | Freq: Once | INTRAMUSCULAR | Status: AC

## 2021-09-17 MED ORDER — CHLORHEXIDINE GLUCONATE CLOTH 2 % EX PADS: 6.0000 | MEDICATED_PAD | Freq: Once | CUTANEOUS | Status: DC

## 2021-09-17 MED ORDER — LACTATED RINGERS IV SOLN: INTRAVENOUS | Status: DC

## 2021-09-17 MED ORDER — CEFAZOLIN SODIUM-DEXTROSE 2-4 GM/100ML-% IV SOLN
2.0000 g | INTRAVENOUS | Status: AC
  Administered 2021-09-18: 2 g via INTRAVENOUS

## 2021-09-17 MED ORDER — CHLORHEXIDINE GLUCONATE 0.12 % MT SOLN: 15.0000 mL | Freq: Once | OROMUCOSAL | Status: AC

## 2021-09-18 ENCOUNTER — Encounter
Admission: AD | Disposition: A | Discharge status: Home / Self Care | Payer: Self-pay | Source: Home / Self Care | Attending: Obstetrics and Gynecology

## 2021-09-18 ENCOUNTER — Other Ambulatory Visit: Payer: Self-pay

## 2021-09-18 ENCOUNTER — Ambulatory Visit: Payer: 59 | Admitting: Urgent Care

## 2021-09-18 ENCOUNTER — Inpatient Hospital Stay
Admission: AD | Admit: 2021-09-18 | Discharge: 2021-09-20 | DRG: 743 | Disposition: A | Discharge status: Home / Self Care | Payer: 59 | Attending: Obstetrics and Gynecology | Admitting: Obstetrics and Gynecology

## 2021-09-18 ENCOUNTER — Encounter: Payer: Self-pay | Admitting: Obstetrics and Gynecology

## 2021-09-18 DIAGNOSIS — N920 Excessive and frequent menstruation with regular cycle: Secondary | ICD-10-CM | POA: Diagnosis not present

## 2021-09-18 DIAGNOSIS — Z90710 Acquired absence of both cervix and uterus: Secondary | ICD-10-CM | POA: Diagnosis present

## 2021-09-18 DIAGNOSIS — N80122 Deep endometriosis of left ovary: Secondary | ICD-10-CM | POA: Diagnosis not present

## 2021-09-18 DIAGNOSIS — Z5331 Laparoscopic surgical procedure converted to open procedure: Secondary | ICD-10-CM | POA: Diagnosis not present

## 2021-09-18 DIAGNOSIS — N80203 Endometriosis of bilateral fallopian tubes, unspecified depth: Secondary | ICD-10-CM | POA: Diagnosis not present

## 2021-09-18 DIAGNOSIS — D259 Leiomyoma of uterus, unspecified: Secondary | ICD-10-CM | POA: Diagnosis present

## 2021-09-18 DIAGNOSIS — N80529 Endometriosis of the sigmoid colon, unspecified depth: Secondary | ICD-10-CM

## 2021-09-18 DIAGNOSIS — R1909 Other intra-abdominal and pelvic swelling, mass and lump: Secondary | ICD-10-CM

## 2021-09-18 DIAGNOSIS — N946 Dysmenorrhea, unspecified: Secondary | ICD-10-CM | POA: Diagnosis not present

## 2021-09-18 DIAGNOSIS — R971 Elevated cancer antigen 125 [CA 125]: Secondary | ICD-10-CM | POA: Diagnosis not present

## 2021-09-18 DIAGNOSIS — Z9882 Breast implant status: Secondary | ICD-10-CM | POA: Diagnosis not present

## 2021-09-18 DIAGNOSIS — I10 Essential (primary) hypertension: Secondary | ICD-10-CM | POA: Diagnosis present

## 2021-09-18 DIAGNOSIS — D6489 Other specified anemias: Secondary | ICD-10-CM | POA: Diagnosis present

## 2021-09-18 DIAGNOSIS — N803 Endometriosis of pelvic peritoneum, unspecified: Secondary | ICD-10-CM | POA: Diagnosis not present

## 2021-09-18 DIAGNOSIS — N80209 Endometriosis of unspecified fallopian tube, unspecified depth: Secondary | ICD-10-CM | POA: Diagnosis not present

## 2021-09-18 DIAGNOSIS — N8003 Adenomyosis of the uterus: Secondary | ICD-10-CM

## 2021-09-18 DIAGNOSIS — N80109 Endometriosis of ovary, unspecified side, unspecified depth: Secondary | ICD-10-CM | POA: Diagnosis not present

## 2021-09-18 DIAGNOSIS — N809 Endometriosis, unspecified: Secondary | ICD-10-CM | POA: Clinically undetermined

## 2021-09-18 DIAGNOSIS — N839 Noninflammatory disorder of ovary, fallopian tube and broad ligament, unspecified: Secondary | ICD-10-CM | POA: Diagnosis present

## 2021-09-18 DIAGNOSIS — D251 Intramural leiomyoma of uterus: Secondary | ICD-10-CM

## 2021-09-18 DIAGNOSIS — R1900 Intra-abdominal and pelvic swelling, mass and lump, unspecified site: Secondary | ICD-10-CM | POA: Diagnosis not present

## 2021-09-18 DIAGNOSIS — N83292 Other ovarian cyst, left side: Secondary | ICD-10-CM

## 2021-09-18 DIAGNOSIS — N83202 Unspecified ovarian cyst, left side: Secondary | ICD-10-CM | POA: Diagnosis present

## 2021-09-18 DIAGNOSIS — N83201 Unspecified ovarian cyst, right side: Secondary | ICD-10-CM | POA: Diagnosis present

## 2021-09-18 DIAGNOSIS — N8000 Endometriosis of the uterus, unspecified: Secondary | ICD-10-CM | POA: Diagnosis not present

## 2021-09-18 HISTORY — PX: LAPAROSCOPIC LYSIS OF ADHESIONS: SHX5905

## 2021-09-18 HISTORY — PX: LAPAROSCOPY: SHX197

## 2021-09-18 HISTORY — PX: SALPINGOOPHORECTOMY: SHX82

## 2021-09-18 HISTORY — PX: CYSTOSCOPY: SHX5120

## 2021-09-18 HISTORY — PX: BILATERAL SALPINGECTOMY: SHX5743

## 2021-09-18 HISTORY — PX: DILATION AND CURETTAGE OF UTERUS: SHX78

## 2021-09-18 HISTORY — PX: ABDOMINAL HYSTERECTOMY: SHX81

## 2021-09-18 HISTORY — PX: HYSTEROSCOPY: SHX211

## 2021-09-18 LAB — ABO/RH: ABO/RH(D): O POS

## 2021-09-18 LAB — POCT PREGNANCY, URINE: Preg Test, Ur: NEGATIVE

## 2021-09-18 SURGERY — EXCISION, CYST, OVARY, BILATERAL, LAPAROSCOPIC
Anesthesia: General

## 2021-09-18 MED ORDER — ONDANSETRON HCL 4 MG/2ML IJ SOLN
4.0000 mg | Freq: Four times a day (QID) | INTRAMUSCULAR | Status: DC | PRN
  Administered 2021-09-18: 4 mg via INTRAVENOUS
  Filled 2021-09-18: qty 2

## 2021-09-18 MED ORDER — KETOROLAC TROMETHAMINE 30 MG/ML IJ SOLN
30.0000 mg | Freq: Four times a day (QID) | INTRAMUSCULAR | Status: DC | PRN
  Administered 2021-09-18 (×2): 30 mg via INTRAVENOUS
  Filled 2021-09-18 (×2): qty 1

## 2021-09-18 MED ORDER — FLUORESCEIN SODIUM 10 % IV SOLN
INTRAVENOUS | Status: AC
  Filled 2021-09-18: qty 5

## 2021-09-18 MED ORDER — SILVER NITRATE-POT NITRATE 75-25 % EX MISC
CUTANEOUS | Status: DC | PRN
  Administered 2021-09-18: 2

## 2021-09-18 MED ORDER — MIDAZOLAM HCL 2 MG/2ML IJ SOLN
INTRAMUSCULAR | Status: DC | PRN
  Administered 2021-09-18: 2 mg via INTRAVENOUS

## 2021-09-18 MED ORDER — ONDANSETRON HCL 4 MG/2ML IJ SOLN: 4.0000 mg | Freq: Once | INTRAMUSCULAR | Status: DC | PRN

## 2021-09-18 MED ORDER — ACETAMINOPHEN 10 MG/ML IV SOLN: 1000.0000 mg | Freq: Once | INTRAVENOUS | Status: DC | PRN

## 2021-09-18 MED ORDER — SUGAMMADEX SODIUM 200 MG/2ML IV SOLN
INTRAVENOUS | Status: DC | PRN
  Administered 2021-09-18: 180 mg via INTRAVENOUS

## 2021-09-18 MED ORDER — OXYCODONE-ACETAMINOPHEN 5-325 MG PO TABS: 1.0000 | ORAL_TABLET | Freq: Four times a day (QID) | ORAL | Status: DC | PRN

## 2021-09-18 MED ORDER — SIMETHICONE 80 MG PO CHEW
80.0000 mg | CHEWABLE_TABLET | Freq: Four times a day (QID) | ORAL | Status: DC | PRN
  Administered 2021-09-18 – 2021-09-20 (×5): 80 mg via ORAL
  Filled 2021-09-18 (×6): qty 1

## 2021-09-18 MED ORDER — ONDANSETRON HCL 4 MG PO TABS: 4.0000 mg | ORAL_TABLET | Freq: Four times a day (QID) | ORAL | Status: DC | PRN

## 2021-09-18 MED ORDER — BSS IO SOLN
15.0000 mL | Freq: Once | INTRAOCULAR | Status: AC
  Administered 2021-09-18: 15 mL
  Filled 2021-09-18: qty 15

## 2021-09-18 MED ORDER — MENTHOL 3 MG MT LOZG: 1.0000 | LOZENGE | OROMUCOSAL | Status: DC | PRN

## 2021-09-18 MED ORDER — MIDAZOLAM HCL 2 MG/2ML IJ SOLN
INTRAMUSCULAR | Status: AC
  Filled 2021-09-18: qty 2

## 2021-09-18 MED ORDER — FLUORESCEIN SODIUM 10 % IV SOLN
INTRAVENOUS | Status: DC | PRN
  Administered 2021-09-18: 50 mg via INTRAVENOUS

## 2021-09-18 MED ORDER — EPHEDRINE 5 MG/ML INJ
INTRAVENOUS | Status: AC
  Filled 2021-09-18: qty 5

## 2021-09-18 MED ORDER — FENTANYL CITRATE (PF) 100 MCG/2ML IJ SOLN
25.0000 ug | INTRAMUSCULAR | Status: DC | PRN
  Administered 2021-09-18 (×3): 25 ug via INTRAVENOUS

## 2021-09-18 MED ORDER — OXYCODONE HCL 5 MG/5ML PO SOLN: 5.0000 mg | Freq: Once | ORAL | Status: AC | PRN

## 2021-09-18 MED ORDER — DOCUSATE SODIUM 100 MG PO CAPS
100.0000 mg | ORAL_CAPSULE | Freq: Two times a day (BID) | ORAL | Status: DC
  Administered 2021-09-18 – 2021-09-20 (×4): 100 mg via ORAL
  Filled 2021-09-18 (×4): qty 1

## 2021-09-18 MED ORDER — LACTATED RINGERS IV SOLN: INTRAVENOUS | Status: DC

## 2021-09-18 MED ORDER — ONDANSETRON HCL 4 MG/2ML IJ SOLN
INTRAMUSCULAR | Status: AC
  Filled 2021-09-18: qty 2

## 2021-09-18 MED ORDER — OXYCODONE-ACETAMINOPHEN 5-325 MG PO TABS: 2.0000 | ORAL_TABLET | Freq: Four times a day (QID) | ORAL | Status: DC | PRN

## 2021-09-18 MED ORDER — HYDROMORPHONE HCL 1 MG/ML IJ SOLN
INTRAMUSCULAR | Status: DC | PRN
  Administered 2021-09-18: .5 mg via INTRAVENOUS

## 2021-09-18 MED ORDER — 0.9 % SODIUM CHLORIDE (POUR BTL) OPTIME
TOPICAL | Status: DC | PRN
  Administered 2021-09-18 (×2): 500 mL

## 2021-09-18 MED ORDER — ONDANSETRON HCL 4 MG/2ML IJ SOLN
INTRAMUSCULAR | Status: DC | PRN
  Administered 2021-09-18: 4 mg via INTRAVENOUS

## 2021-09-18 MED ORDER — HYDROMORPHONE HCL 1 MG/ML IJ SOLN
0.2000 mg | INTRAMUSCULAR | Status: DC | PRN
  Administered 2021-09-18 – 2021-09-19 (×6): 0.5 mg via INTRAVENOUS
  Filled 2021-09-18 (×6): qty 0.5

## 2021-09-18 MED ORDER — ACETAMINOPHEN 10 MG/ML IV SOLN
INTRAVENOUS | Status: AC
  Filled 2021-09-18: qty 100

## 2021-09-18 MED ORDER — LIDOCAINE HCL (PF) 2 % IJ SOLN
INTRAMUSCULAR | Status: AC
Start: 2021-09-18 — End: ?
  Filled 2021-09-18: qty 5

## 2021-09-18 MED ORDER — CEFAZOLIN SODIUM-DEXTROSE 2-4 GM/100ML-% IV SOLN
INTRAVENOUS | Status: AC
  Filled 2021-09-18: qty 100

## 2021-09-18 MED ORDER — FENTANYL CITRATE (PF) 100 MCG/2ML IJ SOLN
INTRAMUSCULAR | Status: DC | PRN
  Administered 2021-09-18 (×2): 50 ug via INTRAVENOUS

## 2021-09-18 MED ORDER — EPHEDRINE SULFATE (PRESSORS) 50 MG/ML IJ SOLN
INTRAMUSCULAR | Status: DC | PRN
  Administered 2021-09-18: 10 mg via INTRAVENOUS

## 2021-09-18 MED ORDER — PROMETHAZINE HCL 25 MG/ML IJ SOLN: 6.2500 mg | INTRAMUSCULAR | Status: DC | PRN

## 2021-09-18 MED ORDER — SILVER NITRATE-POT NITRATE 75-25 % EX MISC
CUTANEOUS | Status: AC
  Filled 2021-09-18: qty 10

## 2021-09-18 MED ORDER — OXYCODONE HCL 5 MG PO TABS
ORAL_TABLET | ORAL | Status: AC
  Filled 2021-09-18: qty 1

## 2021-09-18 MED ORDER — PROPOFOL 10 MG/ML IV BOLUS
INTRAVENOUS | Status: DC | PRN
  Administered 2021-09-18: 150 mg via INTRAVENOUS

## 2021-09-18 MED ORDER — HYDROMORPHONE HCL 1 MG/ML IJ SOLN
INTRAMUSCULAR | Status: AC
  Filled 2021-09-18: qty 1

## 2021-09-18 MED ORDER — HEPARIN SODIUM (PORCINE) 5000 UNIT/ML IJ SOLN
INTRAMUSCULAR | Status: AC
  Administered 2021-09-18: 5000 [IU] via SUBCUTANEOUS
  Filled 2021-09-18: qty 1

## 2021-09-18 MED ORDER — CHLORHEXIDINE GLUCONATE 0.12 % MT SOLN
OROMUCOSAL | Status: AC
  Administered 2021-09-18: 15 mL via OROMUCOSAL
  Filled 2021-09-18: qty 15

## 2021-09-18 MED ORDER — KETOROLAC TROMETHAMINE 0.5 % OP SOLN
1.0000 [drp] | Freq: Four times a day (QID) | OPHTHALMIC | Status: AC
  Administered 2021-09-18 – 2021-09-19 (×4): 1 [drp] via OPHTHALMIC
  Filled 2021-09-18: qty 3

## 2021-09-18 MED ORDER — DEXAMETHASONE SODIUM PHOSPHATE 10 MG/ML IJ SOLN
INTRAMUSCULAR | Status: AC
  Filled 2021-09-18: qty 1

## 2021-09-18 MED ORDER — LIDOCAINE HCL (CARDIAC) PF 100 MG/5ML IV SOSY
PREFILLED_SYRINGE | INTRAVENOUS | Status: DC | PRN
  Administered 2021-09-18: 80 mg via INTRAVENOUS

## 2021-09-18 MED ORDER — ATENOLOL 50 MG PO TABS
50.0000 mg | ORAL_TABLET | Freq: Every day | ORAL | Status: DC
  Administered 2021-09-19 – 2021-09-20 (×2): 50 mg via ORAL
  Filled 2021-09-18 (×2): qty 1

## 2021-09-18 MED ORDER — OXYCODONE HCL 5 MG PO TABS
5.0000 mg | ORAL_TABLET | Freq: Once | ORAL | Status: AC | PRN
  Administered 2021-09-18: 5 mg via ORAL

## 2021-09-18 MED ORDER — ROCURONIUM BROMIDE 100 MG/10ML IV SOLN
INTRAVENOUS | Status: DC | PRN
  Administered 2021-09-18 (×2): 20 mg via INTRAVENOUS
  Administered 2021-09-18: 30 mg via INTRAVENOUS
  Administered 2021-09-18: 50 mg via INTRAVENOUS

## 2021-09-18 MED ORDER — TRIAMTERENE-HCTZ 37.5-25 MG PO TABS
1.0000 | ORAL_TABLET | Freq: Every day | ORAL | Status: DC
  Administered 2021-09-19 – 2021-09-20 (×2): 1 via ORAL
  Filled 2021-09-18 (×2): qty 1

## 2021-09-18 MED ORDER — ENOXAPARIN SODIUM 40 MG/0.4ML IJ SOSY
40.0000 mg | PREFILLED_SYRINGE | INTRAMUSCULAR | Status: DC
  Administered 2021-09-19: 40 mg via SUBCUTANEOUS
  Filled 2021-09-18: qty 0.4

## 2021-09-18 MED ORDER — ACETAMINOPHEN 10 MG/ML IV SOLN
INTRAVENOUS | Status: DC | PRN
  Administered 2021-09-18: 1000 mg via INTRAVENOUS

## 2021-09-18 MED ORDER — DEXAMETHASONE SODIUM PHOSPHATE 10 MG/ML IJ SOLN
INTRAMUSCULAR | Status: DC | PRN
  Administered 2021-09-18: 10 mg via INTRAVENOUS

## 2021-09-18 MED ORDER — FENTANYL CITRATE (PF) 100 MCG/2ML IJ SOLN
INTRAMUSCULAR | Status: AC
  Administered 2021-09-18: 25 ug via INTRAVENOUS
  Filled 2021-09-18: qty 2

## 2021-09-18 MED ORDER — BUPIVACAINE HCL (PF) 0.5 % IJ SOLN
INTRAMUSCULAR | Status: AC
  Filled 2021-09-18: qty 30

## 2021-09-18 MED ORDER — KETAMINE HCL 50 MG/5ML IJ SOSY
PREFILLED_SYRINGE | INTRAMUSCULAR | Status: AC
  Filled 2021-09-18: qty 5

## 2021-09-18 MED ORDER — FENTANYL CITRATE (PF) 100 MCG/2ML IJ SOLN
INTRAMUSCULAR | Status: AC
  Filled 2021-09-18: qty 2

## 2021-09-18 MED ORDER — BUPIVACAINE HCL (PF) 0.5 % IJ SOLN
INTRAMUSCULAR | Status: DC | PRN
  Administered 2021-09-18: 7 mL

## 2021-09-18 MED ORDER — KETAMINE HCL 10 MG/ML IJ SOLN
INTRAMUSCULAR | Status: DC | PRN
  Administered 2021-09-18: 20 mg via INTRAVENOUS
  Administered 2021-09-18: 30 mg via INTRAVENOUS

## 2021-09-18 MED ORDER — IBUPROFEN 600 MG PO TABS
600.0000 mg | ORAL_TABLET | Freq: Four times a day (QID) | ORAL | Status: DC
  Administered 2021-09-19 – 2021-09-20 (×5): 600 mg via ORAL
  Filled 2021-09-18 (×5): qty 1

## 2021-09-18 MED ORDER — PROPOFOL 10 MG/ML IV BOLUS
INTRAVENOUS | Status: AC
  Filled 2021-09-18: qty 20

## 2021-09-18 SURGICAL SUPPLY — 64 items
BAG URINE DRAIN 2000ML AR STRL (UROLOGICAL SUPPLIES) ×5 IMPLANT
BLADE SURG 15 STRL LF DISP TIS (BLADE) ×4 IMPLANT
BLADE SURG 15 STRL SS (BLADE) ×1
BLADE SURG SZ10 CARB STEEL (BLADE) ×1 IMPLANT
CANNULA DILATOR 5 W/SLV (CANNULA) ×5 IMPLANT
CATH FOLEY 2WAY  5CC 16FR (CATHETERS) ×1
CATH URTH 16FR FL 2W BLN LF (CATHETERS) ×4 IMPLANT
CNTNR SPEC 2.5X3XGRAD LEK (MISCELLANEOUS) ×4
CONT SPEC 4OZ STER OR WHT (MISCELLANEOUS) ×1
CONTAINER SPEC 2.5X3XGRAD LEK (MISCELLANEOUS) ×4 IMPLANT
CORD MONOPOLAR M/FML 12FT (MISCELLANEOUS) ×5 IMPLANT
DERMABOND ADVANCED (GAUZE/BANDAGES/DRESSINGS) ×1
DERMABOND ADVANCED .7 DNX12 (GAUZE/BANDAGES/DRESSINGS) ×4 IMPLANT
DRAPE STERI POUCH LG 24X46 STR (DRAPES) ×10 IMPLANT
DRAPE UNDER BUTTOCK W/FLU (DRAPES) ×5 IMPLANT
ELECT CAUTERY BLADE 6.4 (BLADE) ×2 IMPLANT
GAUZE 4X4 16PLY ~~LOC~~+RFID DBL (SPONGE) ×9 IMPLANT
GLOVE BIO SURGEON STRL SZ 6.5 (GLOVE) ×5 IMPLANT
GLOVE BIO SURGEON STRL SZ7 (GLOVE) ×5 IMPLANT
GLOVE BIO SURGEON STRL SZ7.5 (GLOVE) ×5 IMPLANT
GLOVE BIO SURGEON STRL SZ8 (GLOVE) ×20 IMPLANT
GLOVE SURG UNDER LTX SZ7.5 (GLOVE) ×5 IMPLANT
GLOVE SURG UNDER LTX SZ8 (GLOVE) ×5 IMPLANT
GOWN STRL REUS W/ TWL LRG LVL3 (GOWN DISPOSABLE) ×8 IMPLANT
GOWN STRL REUS W/ TWL XL LVL3 (GOWN DISPOSABLE) ×12 IMPLANT
GOWN STRL REUS W/TWL LRG LVL3 (GOWN DISPOSABLE) ×2
GOWN STRL REUS W/TWL XL LVL3 (GOWN DISPOSABLE) ×3
GRASPER SUT TROCAR 14GX15 (MISCELLANEOUS) ×5 IMPLANT
HANDLE YANKAUER SUCT BULB TIP (MISCELLANEOUS) ×1 IMPLANT
KIT PINK PAD W/HEAD ARE REST (MISCELLANEOUS) ×5
KIT PINK PAD W/HEAD ARM REST (MISCELLANEOUS) ×4 IMPLANT
KIT PROCEDURE FLUENT (KITS) ×1 IMPLANT
KIT TURNOVER CYSTO (KITS) ×5 IMPLANT
LABEL OR SOLS (LABEL) ×5 IMPLANT
LIGASURE VESSEL 5MM BLUNT TIP (ELECTROSURGICAL) ×1 IMPLANT
MANIFOLD NEPTUNE II (INSTRUMENTS) ×5 IMPLANT
NDL INSUFF ACCESS 14 VERSASTEP (NEEDLE) ×6 IMPLANT
NS IRRIG 500ML POUR BTL (IV SOLUTION) ×5 IMPLANT
OCCLUDER COLPOPNEUMO (BALLOONS) ×4 IMPLANT
PACK DNC HYST (MISCELLANEOUS) ×5 IMPLANT
PACK GYN LAPAROSCOPIC (MISCELLANEOUS) ×5 IMPLANT
PAD OB MATERNITY 4.3X12.25 (PERSONAL CARE ITEMS) ×5 IMPLANT
PAD PREP 24X41 OB/GYN DISP (PERSONAL CARE ITEMS) ×5 IMPLANT
PENCIL ELECTRO HAND CTR (MISCELLANEOUS) ×1 IMPLANT
SET TUBE SMOKE EVAC HIGH FLOW (TUBING) ×5 IMPLANT
SHEARS ENDO 5MM 31CM (CUTTER) ×1 IMPLANT
SPONGE T-LAP 18X18 ~~LOC~~+RFID (SPONGE) ×3 IMPLANT
SUT MAXON 0 T 12 3 (SUTURE) ×2 IMPLANT
SUT MNCRL 4-0 (SUTURE) ×1
SUT MNCRL 4-0 27XMFL (SUTURE) ×4
SUT VIC AB 0 CT1 27 (SUTURE) ×3
SUT VIC AB 0 CT1 27XCR 8 STRN (SUTURE) IMPLANT
SUT VIC AB 0 CT1 36 (SUTURE) ×5 IMPLANT
SUT VIC AB 2-0 UR6 27 (SUTURE) ×1 IMPLANT
SUT VIC AB 3-0 SH 27 (SUTURE) ×1
SUT VIC AB 3-0 SH 27X BRD (SUTURE) IMPLANT
SUT VICRYL 0 AB UR-6 (SUTURE) ×5 IMPLANT
SUTURE MNCRL 4-0 27XMF (SUTURE) ×8 IMPLANT
SYR 10ML LL (SYRINGE) ×5 IMPLANT
SYR 50ML LL SCALE MARK (SYRINGE) ×6 IMPLANT
TROCAR BLUNT TIP 12MM OMST12BT (TROCAR) ×5 IMPLANT
TROCAR VERSASTEP PLUS 12MM (TROCAR) ×5 IMPLANT
TROCAR VERSASTEP PLUS 5MM (TROCAR) ×7 IMPLANT
WATER STERILE IRR 500ML POUR (IV SOLUTION) ×5 IMPLANT

## 2021-09-18 NOTE — Anesthesia Postprocedure Evaluation (Signed)
Anesthesia Post Note ? ?Patient: Jennifer Olsen ? ?Procedure(s) Performed: BILATERAL LAPAROSCOPIC OVARIAN CYSTECTOMY (Left) ?DILATATION AND CURETTAGE ?HYSTEROSCOPY ?OPEN SALPINGO OOPHORECTOMY (Left) ?OPEN BILATERAL SALPINGECTOMY (Bilateral) ?CYSTOSCOPY ?HYSTERECTOMY ABDOMINAL ?LAPAROSCOPY OPERATIVE ?LAPAROSCOPIC LYSIS OF ADHESIONS ? ?Patient location during evaluation: PACU ?Anesthesia Type: General ?Level of consciousness: awake and alert ?Pain management: pain level controlled ?Vital Signs Assessment: post-procedure vital signs reviewed and stable ?Respiratory status: spontaneous breathing, nonlabored ventilation, respiratory function stable and patient connected to nasal cannula oxygen ?Cardiovascular status: blood pressure returned to baseline and stable ?Postop Assessment: no apparent nausea or vomiting ?Anesthetic complications: no ? ? ?No notable events documented. ? ? ?Last Vitals:  ?Vitals:  ? 09/18/21 1545 09/18/21 1600  ?BP: 101/86 115/67  ?Pulse: 88 81  ?Resp: 16 13  ?Temp:    ?SpO2: 100% 99%  ?  ?Last Pain:  ?Vitals:  ? 09/18/21 1545  ?TempSrc:   ?PainSc: 5   ? ? ?  ?  ?  ?  ?  ?  ? ?Molli Barrows ? ? ? ? ?

## 2021-09-18 NOTE — Interval H&P Note (Signed)
History and Physical Interval Note: ? ?09/18/2021 ?10:39 AM ? ?Jennifer Olsen  has presented today for surgery, with the diagnosis of Pelvic Mass- R19.0.  The various methods of treatment have been discussed with the patient and family. After consideration of risks, benefits and other options for treatment, the patient has consented to  Procedure(s) with comments: ?BILATERAL LAPAROSCOPIC OVARIAN CYSTECTOMY (Left) - Possible removal of left tube and ovary, possible hysterectomy, possible salpingectomy, possible pelvic/aortic lymph node biopsies, possible laparotomy and staging, possible removal of both ovaries ?DILATATION AND CURETTAGE (N/A) as a surgical intervention.  The patient's history has been reviewed, patient examined, no change in status, stable for surgery.  I have reviewed the patient's chart and labs.  Questions were answered to the patient's and husband's satisfaction.   ? ? ?Mellody Drown ? ? ?

## 2021-09-18 NOTE — Anesthesia Preprocedure Evaluation (Addendum)
Anesthesia Evaluation  ?Patient identified by MRN, date of birth, ID band ?Patient awake ? ? ? ?Reviewed: ?Allergy & Precautions, NPO status , Patient's Chart, lab work & pertinent test results, reviewed documented beta blocker date and time  ? ?Airway ?Mallampati: II ? ?TM Distance: >3 FB ?Neck ROM: full ? ? ? Dental ?no notable dental hx. ? ?  ?Pulmonary ?neg pulmonary ROS,  ?  ?Pulmonary exam normal ? ? ? ? ? ? ? Cardiovascular ?hypertension, Pt. on medications and Pt. on home beta blockers ?Normal cardiovascular exam ? ?ECG 09/10/21:  ?Sinus bradycardia (HR 57) ?Otherwise normal ECG ?When compared with ECG of 16-Dec-2010 08:46, ?No significant change was found ?  ?Neuro/Psych ? Headaches, negative psych ROS  ? GI/Hepatic ?negative GI ROS, Neg liver ROS,   ?Endo/Other  ?negative endocrine ROS ? Renal/GU ?  ? ?  ?Musculoskeletal ? ? Abdominal ?Normal abdominal exam  (+)   ?Peds ? Hematology ?negative hematology ROS ?(+)   ?Anesthesia Other Findings ?Past Medical History: ?No date: Hypertension ?    Comment:  s/p renal artery doppler which was normal ?No date: Persistent headaches ?No date: Thyroid nodule ? ?Past Surgical History: ?05/2015: AUGMENTATION MAMMAPLASTY; Bilateral ?    Comment:  saline ?08/19/2021: COLONOSCOPY WITH PROPOFOL; N/A ?    Comment:  Procedure: COLONOSCOPY WITH PROPOFOL;  Surgeon: Allen Norris,  ?             Darren, MD;  Location: [place];  Service:  ?             Endoscopy;  Laterality: N/A; ?No date: FOOT SURGERY ?    Comment:  bilateral, bunions, Dr. Elvina Mattes ?2010: NOVASURE ABLATION ?    Comment:  Dr. Amalia Hailey ?2011: WRIST SURGERY ?    Comment:  left wrist, fracture, Dr. Mauri Pole ? ?BMI   ? Body Mass Index: 23.76 kg/m?  ?  ? ? Reproductive/Obstetrics ?negative OB ROS ? ?  ? ? ? ? ? ? ? ? ? ? ? ? ? ?  ?  ? ? ? ? ? ? ?Anesthesia Physical ?Anesthesia Plan ? ?ASA: 2 ? ?Anesthesia Plan: General  ? ?Post-op Pain Management: Toradol IV (intra-op)* and Ofirmev IV  (intra-op)*  ? ?Induction: Intravenous ? ?PONV Risk Score and Plan: 4 or greater and Ondansetron, Dexamethasone, Treatment may vary due to age or medical condition, Midazolam and Propofol infusion ? ?Airway Management Planned: Oral ETT ? ?Additional Equipment:  ? ?Intra-op Plan:  ? ?Post-operative Plan: Extubation in OR ? ?Informed Consent: I have reviewed the patients History and Physical, chart, labs and discussed the procedure including the risks, benefits and alternatives for the proposed anesthesia with the patient or authorized representative who has indicated his/her understanding and acceptance.  ? ? ? ?Dental advisory given ? ?Plan Discussed with: CRNA ? ?Anesthesia Plan Comments: (Patient consented for risks of anesthesia including but not limited to:  ?- adverse reactions to medications ?- damage to eyes, teeth, lips or other oral mucosa ?- nerve damage due to positioning  ?- sore throat or hoarseness ?- damage to heart, brain, nerves, lungs, other parts of body or loss of life ? ?Informed patient about role of CRNA in peri- and intra-operative care.  Patient voiced understanding.)  ? ? ? ? ? ?Anesthesia Quick Evaluation ? ?

## 2021-09-18 NOTE — Progress Notes (Signed)
Discussed PO PRN pain med options that pt has available. Pt prefers IV pain meds at this time.  ?

## 2021-09-18 NOTE — Anesthesia Procedure Notes (Signed)
Procedure Name: Intubation ?Date/Time: 09/18/2021 10:58 AM ?Performed by: Fredderick Phenix, CRNA ?Pre-anesthesia Checklist: Patient identified, Emergency Drugs available, Suction available and Patient being monitored ?Patient Re-evaluated:Patient Re-evaluated prior to induction ?Oxygen Delivery Method: Circle system utilized ?Preoxygenation: Pre-oxygenation with 100% oxygen ?Induction Type: IV induction ?Ventilation: Mask ventilation without difficulty ?Laryngoscope Size: McGraph and 4 ?Grade View: Grade I ?Tube type: Oral ?Tube size: 7.0 mm ?Number of attempts: 1 ?Airway Equipment and Method: Stylet and Oral airway ?Placement Confirmation: ETT inserted through vocal cords under direct vision, positive ETCO2 and breath sounds checked- equal and bilateral ?Secured at: 21 cm ?Tube secured with: Tape ?Dental Injury: Teeth and Oropharynx as per pre-operative assessment  ? ? ? ? ?

## 2021-09-18 NOTE — Op Note (Addendum)
? ?Operative Note  ? ?09/18/2021 ?3:35 PM ? ?PRE-OP DIAGNOSIS: Pelvic Mass with elevated CA125, fibroids. prior endometrial ablation. ?  ?POST-OP DIAGNOSIS: Endometrioma of left ovary, uterine fibroids, right hemato-salpinx  ? ?SURGEON: Surgeon(s) and Role: ?   Will Bonnet, MD - Primary ?   Mellody Drown, MD - Assisting ? ?ANESTHESIA: General ET ? ?PROCEDURE:  ?HYSTEROSCOPY ?DIAGNOSTIC LAPAROSCOPY ?LYSIS OF ADHESIONS  ?EXPLORATORY LAPAROTOMY ?LEFT SALPINGO OOPHORECTOMY ?RIGHT SALPINGECTOMY ?HYSTERECTOMY ABDOMINAL ?BIOPSY OF SIGMOID MESENTERY ?CYSTOSCOPY ? ?ESTIMATED BLOOD LOSS: 200 mL ? ?DRAINS: NONE ? ?TOTAL IV FLUIDS: PER ANESTHESIA ? ?SPECIMENS: LEFT FALLOPIAN TUBE AND OVARY, UTERUS, RIGHT FALLOPIAN TUBE, SIGMOID IMPLANT, PERITONEAL FLUID ? ?COMPLICATIONS: NONE ? ?DISPOSITION: PACU - hemodynamically stable. ? ?CONDITION: stable ? ?INDICATIONS: 10 CM  LEFT ADNEXAL MASS WITH MILDLY ELEVATED CA125 CONCERNING FOR BENIGN OR MALIGNANT OVARIAN TUMOR.   ? ?FINDINGS: Large 10 cm bilobed left ovarian mass with adhesions to the uterus and sigmoid colon.  Right fallopian tube with hemato-salpinx, right ovary normal.  Endometriotic staining on omentum and right diaphragm.  Uterus about 12 weeks size with fibroid.  Cervix also bulky, but no obvious pathology. ? ?PROCEDURE IN DETAIL: After informed consent was obtained, the patient was taken to the operating room where general endotracheal anesthesia was performed without difficulty. The patient was positioned in the dorsal lithotomy position in [place] and the usual precautions taken with her arms tucked bilaterally. She was prepped and draped in normal sterile fashion. Time-out was performed and a Foley catheter was placed into the bladder.  Sterile speculum paced.  The cervix was grasped with the single-tooth tenaculum and dilated and hysteroscopy was performed with distending media and the cavity was noted to be partially obliterated.  No other  abnormal findings. Hysteroscope removed. single-tooth tenaculum removed. Hemostasis on cervix with silver nitrate. Speculum removed.  ? ?An open Hasson technique was used to place an umbilical 56-YB baloon trocar under direct visualization. The laparoscope was introduced and CO2 gas was infused for pneumoperitoneum to a pressure of 15 mm Hg.  Right and left lateral 5-mm ports and a 5 mm suprapubic port were placed under direct visualization of the laparoscope using an EndoStep technique.  Peritoneal fluid 20 ml was obtained for cytology.  The patient was placed in Trendelenburg and the bowel was displaced up into the upper abdomen. Adhesions of the colon mesentery to the left adnexa and uterus were taken down bluntly and with endoshears.  We attempted to isolate the left ovarian vessels, but this was difficult due to scaring and poor exposure.  Because the ureter was at risk and a difficult dissection of endometriosis was anticipated it was elected to convert to laparotomy. ? ?A low transverse skim incision was made and carried down to the underlying fascia. The fascia was incised and reflected upward and down to expose the rectus muscles. The peritoneum was then entered and the bilateral rectus tendons were partially transected to facilitate exposure.  The operative findings noted above were identified. The bowel was freed up and placed up into the upper abdomen, packed away and a Bookwalter abdominal retractor was then placed. Round ligaments were then divided on each side with electrocautery and the retroperitoneal space was opened bilaterally. The ureters were identified and preserved. The left infundibulopelvic ligament was skeletonized, divided, then suture ligated. The right mesosalpinx was transected to free the tube from the ovary.  The adnexa was then freed from the uterus by transecting the utero ovarian ligament.  The  bladder flap was created with electrocautery and the bladder was dissected down off the  lower uterine segment and cervix. The uterine arteries were then skeletonized bilaterally clamped, cut, and divided, then suture ligated with 0 Vicryl. The cardinal ligaments were then clamped cut and divided and suture ligated as well. The cervix was enlarged so the vaginal was entered anteriorly and then angle clamps placed and the uterus and cervix transected and removed.  The vaginal angles were then suture ligated and tagged. The vaginal walls were grasped and the vaginal cuff was closed with 0 Vicryl sutures in a figure-of-eight fashion with careful attention to include the vaginal cuff angles and the vaginal mucosa within the closure and avoid the bladder. A speculum was placed in the vagina and confirmed intact closure and no bleeding. Digital examination of the closure was also performed and the no defects were noted.  Frozen section of the left ovarian mass showed endometrioma.   ? ?After removing the foley, cystoscopy was performed with 0.5 ml of fluorescent dye IV after insufflation with saline and showed prompt efflux through both ureteral orifices.  The bladder wall was intact.  Scope was removed and foley replaced.  ? ?The right ovary was pexed to the lateral peritoneum.  After the abdomen was irrigated and all pedicles felt to be hemostatic the laparotomy packs were removed.  The partially transected rectus tendons were approximated with interrupted sutures of 0 Vicryl.  The umbilical fascia was closed with a figure of eight of 0 Vicryl.  The three LS skin incisions were closed with 4-0 Biosyn. The main incision fascia was then reapproximated with running 0 Maxon tied in the middle.  The subcutaneous space was then irrigated, and the skin was closed with subdermal sutures and running subcuticular 4-0 Biosyn. A dressing was placed. The patient tolerated the procedure well. Sponge, lap, needle and instrument counts were correct x2 at the end of the procedure.  ? ?Antibiotics: Given 1st or 2nd  generation cephalosporin, Antibiotics given within 1 hour of the start of the procedure, Antibiotics ordered to be discontinued within 24 hours post procedure ? ?VTE prophylaxis: was ordered perioperatively with IPCs and SQ heparin.   ? ?Mellody Drown, MD ? ?I have reviewed this note for accuracy and made changes where indicated.  Dr. Fransisca Connors performed the procedure in the role of assistant.  He assisted in port site creation and placement of trochars. He assisted in lysis of adhesions, conversion to open procedure. He performed retraction, dissection along the right broad ligament, and assisted with the colpotomy formation and closure.  Dr. Fransisca Connors was used in the surgery due to some concern for risk of malignancy.  If the preliminary had shown malignancy, Dr. Cassell Smiles would have been needed to perform staging.   ? ?Prentice Docker, MD, FACOG ?[facility] ?09/18/2021 5:09 PM   ?  ?

## 2021-09-18 NOTE — Transfer of Care (Signed)
Immediate Anesthesia Transfer of Care Note ? ?Patient: Jennifer Olsen ? ?Procedure(s) Performed: BILATERAL LAPAROSCOPIC OVARIAN CYSTECTOMY (Left) ?DILATATION AND CURETTAGE ?HYSTEROSCOPY ?OPEN SALPINGO OOPHORECTOMY (Left) ?OPEN BILATERAL SALPINGECTOMY (Bilateral) ?CYSTOSCOPY ?HYSTERECTOMY ABDOMINAL ?LAPAROSCOPY OPERATIVE ?LAPAROSCOPIC LYSIS OF ADHESIONS ? ?Patient Location: PACU ? ?Anesthesia Type:General ? ?Level of Consciousness: awake, alert  and oriented ? ?Airway & Oxygen Therapy: Patient Spontanous Breathing and Patient connected to face mask oxygen ? ?Post-op Assessment: Report given to RN and Post -op Vital signs reviewed and stable ? ?Post vital signs: Reviewed and stable ? ?Last Vitals:  ?Vitals Value Taken Time  ?BP 111/69 09/18/21 1523  ?Temp    ?Pulse 75 09/18/21 1527  ?Resp 16 09/18/21 1527  ?SpO2 100 % 09/18/21 1527  ?Vitals shown include unvalidated device data. ? ?Last Pain:  ?Vitals:  ? 09/18/21 1027  ?TempSrc: Oral  ?PainSc: 0-No pain  ?   ? ?Patients Stated Pain Goal: 0 (09/18/21 1027) ? ?Complications: No notable events documented. ?

## 2021-09-19 ENCOUNTER — Encounter: Payer: Self-pay | Admitting: Obstetrics and Gynecology

## 2021-09-19 LAB — CBC
HCT: 35.1 % — ABNORMAL LOW (ref 36.0–46.0)
Hemoglobin: 11.8 g/dL — ABNORMAL LOW (ref 12.0–15.0)
MCH: 31.1 pg (ref 26.0–34.0)
MCHC: 33.6 g/dL (ref 30.0–36.0)
MCV: 92.6 fL (ref 80.0–100.0)
Platelets: 213 10*3/uL (ref 150–400)
RBC: 3.79 MIL/uL — ABNORMAL LOW (ref 3.87–5.11)
RDW: 12.4 % (ref 11.5–15.5)
WBC: 7.9 10*3/uL (ref 4.0–10.5)
nRBC: 0 % (ref 0.0–0.2)

## 2021-09-19 LAB — COMPREHENSIVE METABOLIC PANEL
ALT: 17 U/L (ref 0–44)
AST: 46 U/L — ABNORMAL HIGH (ref 15–41)
Albumin: 3.5 g/dL (ref 3.5–5.0)
Alkaline Phosphatase: 37 U/L — ABNORMAL LOW (ref 38–126)
Anion gap: 5 (ref 5–15)
BUN: 15 mg/dL (ref 6–20)
CO2: 29 mmol/L (ref 22–32)
Calcium: 8.2 mg/dL — ABNORMAL LOW (ref 8.9–10.3)
Chloride: 100 mmol/L (ref 98–111)
Creatinine, Ser: 0.97 mg/dL (ref 0.44–1.00)
GFR, Estimated: >60 mL/min (ref >60)
Glucose, Bld: 110 mg/dL — ABNORMAL HIGH (ref 70–99)
Potassium: 3.4 mmol/L — ABNORMAL LOW (ref 3.5–5.1)
Sodium: 134 mmol/L — ABNORMAL LOW (ref 135–145)
Total Bilirubin: 0.9 mg/dL (ref 0.3–1.2)
Total Protein: 5.8 g/dL — ABNORMAL LOW (ref 6.5–8.1)

## 2021-09-19 MED ORDER — HYDROCODONE-ACETAMINOPHEN 5-325 MG PO TABS
2.0000 | ORAL_TABLET | ORAL | Status: DC | PRN
  Administered 2021-09-19 – 2021-09-20 (×3): 2 via ORAL
  Filled 2021-09-19 (×4): qty 2

## 2021-09-19 MED ORDER — OXYCODONE HCL 5 MG PO TABS: 5.0000 mg | ORAL_TABLET | ORAL | Status: DC | PRN

## 2021-09-19 MED ORDER — LACTATED RINGERS IV BOLUS
500.0000 mL | Freq: Once | INTRAVENOUS | Status: AC
  Administered 2021-09-19: 500 mL via INTRAVENOUS

## 2021-09-19 MED ORDER — OXYCODONE HCL 5 MG PO TABS
10.0000 mg | ORAL_TABLET | Freq: Four times a day (QID) | ORAL | Status: DC | PRN
Start: 2021-09-19 — End: 2021-09-19

## 2021-09-19 MED ORDER — OXYCODONE HCL 5 MG PO TABS
10.0000 mg | ORAL_TABLET | ORAL | Status: DC | PRN
  Administered 2021-09-19: 10 mg via ORAL
  Filled 2021-09-19: qty 2

## 2021-09-19 MED ORDER — OXYCODONE HCL 5 MG PO TABS
5.0000 mg | ORAL_TABLET | Freq: Four times a day (QID) | ORAL | Status: DC | PRN
Start: 2021-09-19 — End: 2021-09-19
  Administered 2021-09-19 (×2): 5 mg via ORAL
  Filled 2021-09-19 (×2): qty 1

## 2021-09-19 NOTE — Progress Notes (Signed)
Spoke with Dr. Glennon Mac. Pt output inadequate. 552m bolus to be given. See new orders.  ?

## 2021-09-19 NOTE — Progress Notes (Signed)
Adequate urinary output. Foley removed. Pt ambulated 4 laps around unit.  ?

## 2021-09-19 NOTE — Progress Notes (Signed)
Daily Benign Gynecology Progress Note ?[name]  ?[id number]  ? ?Chief Complaints: postoperative management after surgery ? ?POD#1  ?Subjective:  ?Overnight Events: no acute events. LR bolus of 500 mL due to inadequate urine output. ?Complaints: none currently ?She denies: fevers, chills, chest pain, trouble breathing, nausea, vomiting, severe abdominal pain (much improved with abdominal binder).  ?She has tolerated: clear liquids ?She reports her pain is well controlled with IV dilaudid and toradol.   ?She is ambulating and has not yet voided with recent catheter removal.  ?She has not passed flatus.   ?Objective:  ?Most recent vitals ?Temp: 97.9 ?F (36.6 ?C)  BP: 130/77  Pulse Rate: 70  Resp: 16  SpO2: 100 %  ? ?Vitals Range over 24 hours ?Temp  Avg: 98.3 ?F (36.8 ?C)  Min: 97.4 ?F (36.3 ?C)  Max: 99 ?F (37.2 ?C) ?BP  Min: 101/86  Max: 146/87 ?Pulse  Avg: 75.6  Min: 70  Max: 88 ?SpO2  Avg: 99.5 %  Min: 98 %  Max: 100 %  ? ?Urine Output: 1,264 mL over 12 hours.   ? ?Physical Exam ?Constitutional:   ?   Appearance: Normal appearance. She is normal weight.  ?HENT:  ?   Head: Normocephalic and atraumatic.  ?Cardiovascular:  ?   Rate and Rhythm: Normal rate and regular rhythm.  ?   Heart sounds: No murmur heard. ?  No friction rub. No gallop.  ?Pulmonary:  ?   Effort: Pulmonary effort is normal.  ?   Breath sounds: Normal breath sounds. No wheezing, rhonchi or rales.  ?Abdominal:  ?   General: Abdomen is flat. Bowel sounds are normal. There is no distension.  ?   Palpations: Abdomen is soft. There is no mass.  ?   Tenderness: There is abdominal tenderness (appropriate). There is no guarding or rebound.  ?   Comments: All incisions: without erythema, induration, warmth, and tenderness. They are clean, dry, and intact. Honeycomb bandage in place over Pfannenstiel skin incision. ?   ?Musculoskeletal:     ?   General: No swelling or tenderness. Normal range of motion.  ?   Right lower leg: No edema.  ?   Left lower leg:  No edema.  ?Neurological:  ?   General: No focal deficit present.  ?   Mental Status: She is alert and oriented to person, place, and time.  ?   Cranial Nerves: No cranial nerve deficit.  ?Skin: ?   General: Skin is warm and dry.  ?   Coloration: Skin is not jaundiced.  ?Psychiatric:     ?   Mood and Affect: Mood normal.     ?   Behavior: Behavior normal.     ?   Judgment: Judgment normal.  ? ? ? ? ?AM Labs ? ?Recent Labs  ?  09/19/21 ?0629  ?WBC 7.9  ?HGB 11.8*  ?HCT 35.1*  ?PLT 213  ?NA 134*  ?K 3.4*  ?CREATININE 0.97  ?BUN 15  ?AST 46*  ?ALT 17  ? ?Lab Results  ?Component Value Date  ? WBC 7.9 09/19/2021  ? WBC 6.4 08/09/2021  ? HGB 11.8 (L) 09/19/2021  ? HGB 14.3 08/09/2021  ? HCT 35.1 (L) 09/19/2021  ? HCT 42.8 08/09/2021  ? PLT 213 09/19/2021  ? PLT 196.0 08/09/2021  ? NA 134 (L) 09/19/2021  ? NA 140 08/09/2021  ? K 3.4 (L) 09/19/2021  ? K 3.7 08/09/2021  ? CREATININE 0.97 09/19/2021  ? CREATININE 0.90  08/09/2021  ? BUN 15 09/19/2021  ? BUN 17 08/09/2021  ? AST 46 (H) 09/19/2021  ? AST 16 08/09/2021  ? ALT 17 09/19/2021  ? ALT 11 08/09/2021  ?  ? ?Assessment:  ?Jennifer Olsen is a 46 y.o. female POD#1 s/p TAH, LSO, right salpingectomy, cystoscopy, hysteroscopy, laparoscopy converted to open procedure for left endometrioma, endometriosis, fibroid uterus, menorrhagia with regular cycles, doing well.   ? ?Plan:  ?Pain Control:  convert to PO. Oxycodone and ibuprofen (holding Tylenol due to likely transient increase in AST) ?Pulmonary:  IS ?Heme: Mild post-op anemia.  Regular, healthy diet will restore normal blood counts.  ?ID: no issues ?GI: No issues.  Active bowel sounds.  ?GU:  catheter removed, awaiting spontaneous void ?Nutrition:  increase diet as tolerated ?IVF: LR at 125 mL/hour. Saline lock after voids and is taking adequate PO ?DVT Prophylaxis:  SCDs and LMWH 40 mg [place] daily starting today ?Activity: already ambulating. Increase slowly as tolerated. Discussed limiting activity/exercise during complete  recvery ?Anticipated Discharge: today or tomorrow.  ? ?Discussed operative findings and rationale for steps taken with patient. All questions answered.  ? ?Prentice Docker, MD  ?09/19/2021 7:42 AM ? ?

## 2021-09-20 ENCOUNTER — Other Ambulatory Visit: Payer: Self-pay

## 2021-09-20 LAB — COMPREHENSIVE METABOLIC PANEL
ALT: 21 U/L (ref 0–44)
AST: 51 U/L — ABNORMAL HIGH (ref 15–41)
Albumin: 3.2 g/dL — ABNORMAL LOW (ref 3.5–5.0)
Alkaline Phosphatase: 39 U/L (ref 38–126)
Anion gap: 4 — ABNORMAL LOW (ref 5–15)
BUN: 10 mg/dL (ref 6–20)
CO2: 30 mmol/L (ref 22–32)
Calcium: 8.4 mg/dL — ABNORMAL LOW (ref 8.9–10.3)
Chloride: 106 mmol/L (ref 98–111)
Creatinine, Ser: 0.82 mg/dL (ref 0.44–1.00)
GFR, Estimated: >60 mL/min (ref >60)
Glucose, Bld: 99 mg/dL (ref 70–99)
Potassium: 4 mmol/L (ref 3.5–5.1)
Sodium: 140 mmol/L (ref 135–145)
Total Bilirubin: 0.3 mg/dL (ref 0.3–1.2)
Total Protein: 5.6 g/dL — ABNORMAL LOW (ref 6.5–8.1)

## 2021-09-20 LAB — CBC
HCT: 31.5 % — ABNORMAL LOW (ref 36.0–46.0)
Hemoglobin: 10.3 g/dL — ABNORMAL LOW (ref 12.0–15.0)
MCH: 30.3 pg (ref 26.0–34.0)
MCHC: 32.7 g/dL (ref 30.0–36.0)
MCV: 92.6 fL (ref 80.0–100.0)
Platelets: 180 10*3/uL (ref 150–400)
RBC: 3.4 MIL/uL — ABNORMAL LOW (ref 3.87–5.11)
RDW: 12.6 % (ref 11.5–15.5)
WBC: 5 10*3/uL (ref 4.0–10.5)
nRBC: 0 % (ref 0.0–0.2)

## 2021-09-20 LAB — SURGICAL PATHOLOGY

## 2021-09-20 LAB — CYTOLOGY - NON PAP

## 2021-09-20 MED ORDER — DOCUSATE SODIUM 100 MG PO CAPS: 100.0000 mg | ORAL_CAPSULE | Freq: Two times a day (BID) | ORAL | 0 refills | Status: DC

## 2021-09-20 MED ORDER — HYDROCODONE-ACETAMINOPHEN 5-325 MG PO TABS
2.0000 | ORAL_TABLET | Freq: Four times a day (QID) | ORAL | 0 refills | Status: AC | PRN
  Filled 2021-09-20: qty 30, 4d supply, fill #0

## 2021-09-20 MED ORDER — IBUPROFEN 600 MG PO TABS
600.0000 mg | ORAL_TABLET | Freq: Four times a day (QID) | ORAL | 0 refills | Status: DC
  Filled 2021-09-20: qty 30, 8d supply, fill #0

## 2021-09-20 MED ORDER — SIMETHICONE 80 MG PO CHEW: 80.0000 mg | CHEWABLE_TABLET | Freq: Four times a day (QID) | ORAL | 0 refills | Status: DC | PRN

## 2021-09-20 MED ORDER — GABAPENTIN 300 MG PO CAPS
300.0000 mg | ORAL_CAPSULE | Freq: Three times a day (TID) | ORAL | 0 refills | Status: DC | PRN
  Filled 2021-09-20: qty 40, 14d supply, fill #0

## 2021-09-20 NOTE — Discharge Summary (Signed)
DC Summary ?Discharge Summary  ? ?Patient ID: ?Jennifer Olsen ?[id number] ?46 y.o. ?1975/06/17 ? ?Admit date: 09/18/2021 ? ?Discharge date: 09/20/2021 ? ?Principal Diagnoses:  ?Menorrhagia with regular cycle ?Left ovarian cyst ?Endometriosis ? ?Secondary Diagnoses:  ?Hypertension ? ?Procedures performed during the hospitalization:  ?HYSTEROSCOPY ?DIAGNOSTIC LAPAROSCOPY ?LYSIS OF ADHESIONS  ?EXPLORATORY LAPAROTOMY ?LEFT SALPINGO OOPHORECTOMY ?RIGHT SALPINGECTOMY ?HYSTERECTOMY ABDOMINAL ?BIOPSY OF SIGMOID MESENTERY ?CYSTOSCOPY ? ?HPI: Jennifer Olsen is a 46 y.o. P52 female who is seen in for cystic ovarian mass diagnosed by ultrasound obtained to assess dysmenorrhea and increased uterine bleeding with menses. ?  ?Patient returns for follow up after initial evaluation 6 weeks ago.  No change in her condition. ?  ?07/17/21: CA125 = 167,  HE4 = 53.3  ?ROMA = 9.8% ?  ?Pelvic US 08/22/21 ?The uterus is anteverted and measures 10.45 x 5.1 x 9 cm.  ?Echo texture is heterogenous with evidence of focal masses.  ?Within the uterus are multiple suspected fibroids measuring:  ?Fibroid 1: measures 4.9 x 2.7 x 4.46 cm, posterior  ?Fibroid 2:measures 2.4 x 1.7 x 2.2 cm, right anterior  ?Fibroid 3: measures 2.8 x 2.34 x 2.6 cm, left lateral  ?The Endometrium measures 6.3 mm (contians fluid measuring 15.3 mm)  ? ?Right Ovary measures 7.3 x 2.7 x 2.9 cm.  ? Three cysts noted: 1) 5.05 x 2 x 2.3 cm (hemorrhagic), 2) 1.5 x 1.1  ?x 1.2 cm (simple), 3) 1.9 x 1.2 x 1.6 cm (complex)  ?  ?Left Ovary measures 9.5 x 8.5 x 8 cm.  ? Cyst noted: measures 9.6 x 7 x 7.5 cm (volume 501.5 cm^3)  ?Survey of the adnexa demonstrates no adnexal masses.  ?There is small free fluid in the cul de sac.  ? ?Impression:  ?1. Fibroid uterus with better defined fibroid measurements than in last  ?study, which only specified two fibroids.  ?2. Irregular fluid within endometrium, appears unchanged compared to prior  ?study.  Likely a small collection of fluid/blood/debris  related to  ?endometrial ablation.  ?3. Right ovary with three cysts noted.  This is a change from the prior  ?study, which only measured 2.7 by 2.2 x 2.9 cm.  Cyst appears hemorrhagic,  ?though significant growth in size of undetermined significance.  ?4. Left ovary with complex cyst with interval increase in size from 367.2  ?cm^3 to now 501.45 cm^3.    ?  ?Gyn History ?NSVD x 3.  She has a h/o of abnormal uterine bleeding and had an endometrial ablation at the age 37 yo. She still has monthly periods that are getting heavier. She presented to Dr. Nicki Reaper who ordered a pelvic ultrasound for evaluation.  ?  ?06/20/2021 Pelvic US ?FINDINGS: ?Uterus: Measurements: 9.3 x 6.5 x 7.9 cm = volume: 250 mL. Multiple uterine masses probably fibroids. The 2 most distinct masses are measured. Right fundal submucosal mass measuring 2.9 x 1.7 x 2.4 cm. Left lower uterine segment posterior subserosal mass measuring 4.1 x 3.1 ?x 3.7 cm. ?  ?Endometrium: Thickness: 15.4 mm. Abnormal complex fluid within the endometrial ?canal measuring up to 15.4 mm. ?  ?Right ovary: Measurements: 4.3 x 2.7 x 1.6 cm = volume: 9.2 mL. Complex cyst ?measuring 2.7 by 2.2 x 2.9 cm suggestive of hemorrhagic cyst. ?  ?Left ovary: Measurements: 8.6 x 6.3 x 7.4 cm = volume: 207.9 mL. Complex left ?adnexal cystic mass measuring 10.2 x 4.8 x 7.5 cm. ?  ?Trace free fluid. ?  ?IMPRESSION: ?1. Large complex left  adnexal cystic mass measuring up to 10.2 cm, versus dilated fallopian tube complicated by hemorrhage or infected material. Surgical consultation is recommended. ?2. Abnormal complex fluid distending the endometrial canal, raising concern for distal obstructing process. ?3. 2.9 cm possible hemorrhagic cyst in the right ovary. Recommend 6-12 week sonographic follow-up for this finding ?4. Trace free fluid in the pelvis. ?5. Uterine fibroids ?  ?Last pap smear 07/2019: NILIM, HPV negative ?  ?See by [facility] for second opinion 07/17/21 with plan to check  tumor markers and repeat US in 6 weeks. ? ?Past Medical History:  ?Diagnosis Date  ? Hypertension   ? s/p renal artery doppler which was normal  ? Persistent headaches   ? Thyroid nodule   ? ? ?Past Surgical History:  ?Procedure Laterality Date  ? ABDOMINAL HYSTERECTOMY  09/18/2021  ? Procedure: HYSTERECTOMY ABDOMINAL;  Surgeon: Mellody Drown, MD;  Location: [facility];  Service: Gynecology;;  ? AUGMENTATION MAMMAPLASTY Bilateral 05/2015  ? saline  ? BILATERAL SALPINGECTOMY Bilateral 09/18/2021  ? Procedure: OPEN BILATERAL SALPINGECTOMY;  Surgeon: Mellody Drown, MD;  Location: [facility];  Service: Gynecology;  Laterality: Bilateral;  ? COLONOSCOPY WITH PROPOFOL N/A 08/19/2021  ? Procedure: COLONOSCOPY WITH PROPOFOL;  Surgeon: Lucilla Lame, MD;  Location: [facility];  Service: Endoscopy;  Laterality: N/A;  ? CYSTOSCOPY  09/18/2021  ? Procedure: CYSTOSCOPY;  Surgeon: Mellody Drown, MD;  Location: [facility];  Service: Gynecology;;  ? DILATION AND CURETTAGE OF UTERUS N/A 09/18/2021  ? Procedure: DILATATION AND CURETTAGE;  Surgeon: Mellody Drown, MD;  Location: [facility];  Service: Gynecology;  Laterality: N/A;  ? FOOT SURGERY    ? bilateral, bunions, Dr. Elvina Mattes  ? HYSTEROSCOPY  09/18/2021  ? Procedure: HYSTEROSCOPY;  Surgeon: Mellody Drown, MD;  Location: [facility];  Service: Gynecology;;  ? LAPAROSCOPIC LYSIS OF ADHESIONS  09/18/2021  ? Procedure: LAPAROSCOPIC LYSIS OF ADHESIONS;  Surgeon: Mellody Drown, MD;  Location: [facility];  Service: Gynecology;;  ? LAPAROSCOPY  09/18/2021  ? Procedure: LAPAROSCOPY OPERATIVE;  Surgeon: Mellody Drown, MD;  Location: [facility];  Service: Gynecology;;  ? [place]  2010  ? Dr. Amalia Hailey  ? SALPINGOOPHORECTOMY Left 09/18/2021  ? Procedure: OPEN SALPINGO OOPHORECTOMY;  Surgeon: Mellody Drown, MD;  Location: [facility];  Service: Gynecology;  Laterality: Left;  ? WRIST SURGERY  2011  ? left wrist, fracture, Dr. Mauri Pole  ? ? ?No Known Allergies ? ?Social History   ? ?Tobacco Use  ? Smoking status: Never  ? Smokeless tobacco: Never  ?Vaping Use  ? Vaping Use: Never used  ?Substance Use Topics  ? Alcohol use: Yes  ?  Comment: Occasional  ? Drug use: No  ? ? ?Family History  ?Problem Relation Age of Onset  ? Aneurysm Mother   ? Heart disease Mother   ? Breast cancer Neg Hx   ? ? ?Hospital Course:  ?Patient admitted for surgery on 09/18/21.  The surgery went without incident (see operative report).  She was admitted for postoperative recovery after laparotomy.  By POD#2 she was ambulating, tolerating PO, voiding, had adequate pain control. She had normal and stable vitals signs. Her lab results were normal and stable, with only a mildly elevated AST.  She was deemed to be safe for discharge. ? ?Discharge Exam: ?BP 121/73 (BP Location: Right Arm)   Pulse 76   Temp 98.3 ?F (36.8 ?C) (Oral)   Resp 18   Ht 5' 7.5" (1.715 m)   Wt 69.9 kg   LMP 09/10/2021  SpO2 100%   BMI 23.76 kg/m?  ?Physical Exam ?Constitutional:   ?   General: She is not in acute distress. ?   Appearance: Normal appearance. She is well-developed.  ?HENT:  ?   Head: Normocephalic and atraumatic.  ?Cardiovascular:  ?   Rate and Rhythm: Normal rate and regular rhythm.  ?   Heart sounds: No murmur heard. ?  No friction rub. No gallop.  ?Pulmonary:  ?   Effort: Pulmonary effort is normal. No respiratory distress.  ?   Breath sounds: Normal breath sounds. No wheezing or rales.  ?Abdominal:  ?   General: Bowel sounds are normal. There is no distension.  ?   Palpations: Abdomen is soft. There is no mass.  ?   Tenderness: There is no abdominal tenderness. There is no guarding or rebound.  ?   Comments: Incisions: without erythema, induration, warmth, and tenderness. They areclean, dry, and intact. Honeycomb dressing intact without evidence of drainage from incision. ?   ?Musculoskeletal:     ?   General: Normal range of motion.  ?   Cervical back: Normal range of motion and neck supple.  ?Neurological:  ?    General: No focal deficit present.  ?   Mental Status: She is alert and oriented to person, place, and time.  ?   Cranial Nerves: No cranial nerve deficit.  ?Skin: ?   General: Skin is warm and dry.  ?   Findings: No erythema.  ?P

## 2021-09-20 NOTE — Consult Note (Addendum)
? ?  [facility] CM Inpatient Consult ? ? ?09/20/2021 ? ?Starlina N Yanes ?01/23/1976 ?[id number] ? ? [place] Patient: [employer] Employee plan ? ?Primary Care Provider:  Einar Pheasant, MD, is an Embedded provider at [facility] that is listed to provide the Transition of Care follow up calls. ?Admitted for planned surgical intervention ? ?BILATERAL LAPAROSCOPIC OVARIAN CYSTECTOMY (Left ? ?Reviewed for post hospital follow up needs. ? Plan: Patient will be followed by [place] Coordinator. Patient has transitioned home. No additional needs assessed at this time. ?  ?For additional questions or referrals please contact: ?  ?Natividad Brood, RN BSN CCM ?[facility] Liaison ? [phone] business mobile phone ?Toll free office [phone]  ?Fax number: [phone] ?[email or web address] ? ? ?

## 2021-09-20 NOTE — Progress Notes (Signed)
Pt discharged home. Discharge instructions, prescriptions, and follow up appointments given to and reviewed with pt. Pt verbalized understanding. Escorted by axillary.   ?

## 2021-09-22 ENCOUNTER — Encounter: Payer: Self-pay | Admitting: Obstetrics and Gynecology

## 2021-09-26 ENCOUNTER — Ambulatory Visit: Payer: 59 | Admitting: Gastroenterology

## 2021-10-09 NOTE — H&P (Signed)
Gynecologic Oncology History and Physical  Referring Provider: Prentice Docker, MD  Chief Concern: complex cystic left ovarian mass, elevated CA125  Subjective:  Jennifer Olsen is a 46 y.o. P25 female who is seen in consultation from Dr. Glennon Mac cystic ovarian mass diagnosed by ultrasound obtained to assess dysmenorrhea and increased uterine bleeding with menses.  Patient returns for follow up after initial evaluation 6 weeks ago.  No change in her condition.  07/17/21: CA125 = 167,  HE4 = 53.3  ROMA = 9.8%  Pelvic US 08/22/21 The uterus is anteverted and measures 10.45 x 5.1 x 9 cm.  Echo texture is heterogenous with evidence of focal masses.  Within the uterus are multiple suspected fibroids measuring:  Fibroid 1: measures 4.9 x 2.7 x 4.46 cm, posterior  Fibroid 2:measures 2.4 x 1.7 x 2.2 cm, right anterior  Fibroid 3: measures 2.8 x 2.34 x 2.6 cm, left lateral  The Endometrium measures 6.3 mm (contians fluid measuring 15.3 mm)   Right Ovary measures 7.3 x 2.7 x 2.9 cm.   Three cysts noted: 1) 5.05 x 2 x 2.3 cm (hemorrhagic), 2) 1.5 x 1.1  x 1.2 cm (simple), 3) 1.9 x 1.2 x 1.6 cm (complex)   Left Ovary measures 9.5 x 8.5 x 8 cm.   Cyst noted: measures 9.6 x 7 x 7.5 cm (volume 501.5 cm^3)  Survey of the adnexa demonstrates no adnexal masses.  There is small free fluid in the cul de sac.   Impression:  1. Fibroid uterus with better defined fibroid measurements than in last  study, which only specified two fibroids.  2. Irregular fluid within endometrium, appears unchanged compared to prior  study.  Likely a small collection of fluid/blood/debris related to  endometrial ablation.  3. Right ovary with three cysts noted.  This is a change from the prior  study, which only measured 2.7 by 2.2 x 2.9 cm.  Cyst appears hemorrhagic,  though significant growth in size of undetermined significance.  4. Left ovary with complex cyst with interval increase in size from 367.2  cm^3 to now  501.45 cm^3.     Gyn History NSVD x 3.  She has a h/o of abnormal uterine bleeding and had an endometrial ablation at the age 46 yo. She still has monthly periods that are getting heavier. She presented to Dr. Nicki Reaper who ordered a pelvic ultrasound for evaluation.   06/20/2021 Pelvic US FINDINGS: Uterus: Measurements: 9.3 x 6.5 x 7.9 cm = volume: 250 mL. Multiple uterine masses probably fibroids. The 2 most distinct masses are measured. Right fundal submucosal mass measuring 2.9 x 1.7 x 2.4 cm. Left lower uterine segment posterior subserosal mass measuring 4.1 x 3.1 x 3.7 cm.   Endometrium: Thickness: 15.4 mm. Abnormal complex fluid within the endometrial canal measuring up to 15.4 mm.   Right ovary: Measurements: 4.3 x 2.7 x 1.6 cm = volume: 9.2 mL. Complex cyst measuring 2.7 by 2.2 x 2.9 cm suggestive of hemorrhagic cyst.   Left ovary: Measurements: 8.6 x 6.3 x 7.4 cm = volume: 207.9 mL. Complex left adnexal cystic mass measuring 10.2 x 4.8 x 7.5 cm.   Trace free fluid.   IMPRESSION: 1. Large complex left adnexal cystic mass measuring up to 10.2 cm, versus dilated fallopian tube complicated by hemorrhage or infected material. Surgical consultation is recommended. 2. Abnormal complex fluid distending the endometrial canal, raising concern for distal obstructing process. 3. 2.9 cm possible hemorrhagic cyst in the right ovary. Recommend 6-12 week  sonographic follow-up for this finding 4. Trace free fluid in the pelvis. 5. Uterine fibroids  Last pap smear 07/2019: NILIM, HPV negative  See by [place] for second opinion 07/17/21 with plan to check tumor markers and repeat US in 6 weeks.  Problem List: Patient Active Problem List   Diagnosis Date Noted   Right ovarian cyst 09/18/2021   Menorrhagia with regular cycle 09/18/2021   Endometriosis 09/18/2021   Status post abdominal hysterectomy 09/18/2021   Left ovarian cyst 08/27/2021   Colon cancer screening    Breast implant status  08/13/2021   Thyroid fullness 08/09/2019   Routine general medical examination at a health care facility 10/20/2012   Hypertension 10/09/2011   Hemorrhoids 10/09/2011   Contraception 10/09/2011    Past Medical History: Past Medical History:  Diagnosis Date   Hypertension    s/p renal artery doppler which was normal   Persistent headaches    Thyroid nodule     Past Surgical History: Past Surgical History:  Procedure Laterality Date   ABDOMINAL HYSTERECTOMY  09/18/2021   Procedure: HYSTERECTOMY ABDOMINAL;  Surgeon: Mellody Drown, MD;  Location: [facility];  Service: Gynecology;;   AUGMENTATION MAMMAPLASTY Bilateral 05/2015   saline   BILATERAL SALPINGECTOMY Bilateral 09/18/2021   Procedure: OPEN BILATERAL SALPINGECTOMY;  Surgeon: Mellody Drown, MD;  Location: [facility];  Service: Gynecology;  Laterality: Bilateral;   COLONOSCOPY WITH PROPOFOL N/A 08/19/2021   Procedure: COLONOSCOPY WITH PROPOFOL;  Surgeon: Lucilla Lame, MD;  Location: [place];  Service: Endoscopy;  Laterality: N/A;   CYSTOSCOPY  09/18/2021   Procedure: CYSTOSCOPY;  Surgeon: Mellody Drown, MD;  Location: [facility];  Service: Gynecology;;   DILATION AND CURETTAGE OF UTERUS N/A 09/18/2021   Procedure: DILATATION AND CURETTAGE;  Surgeon: Mellody Drown, MD;  Location: [facility];  Service: Gynecology;  Laterality: N/A;   FOOT SURGERY     bilateral, bunions, Dr. Elvina Mattes   HYSTEROSCOPY  09/18/2021   Procedure: HYSTEROSCOPY;  Surgeon: Mellody Drown, MD;  Location: [facility];  Service: Gynecology;;   LAPAROSCOPIC LYSIS OF ADHESIONS  09/18/2021   Procedure: LAPAROSCOPIC LYSIS OF ADHESIONS;  Surgeon: Mellody Drown, MD;  Location: [facility];  Service: Gynecology;;   LAPAROSCOPY  09/18/2021   Procedure: LAPAROSCOPY OPERATIVE;  Surgeon: Mellody Drown, MD;  Location: [facility];  Service: Gynecology;;   [facility] ABLATION  2010   Dr. Amalia Hailey   SALPINGOOPHORECTOMY Left 09/18/2021   Procedure: OPEN SALPINGO  OOPHORECTOMY;  Surgeon: Mellody Drown, MD;  Location: [facility];  Service: Gynecology;  Laterality: Left;   WRIST SURGERY  2011   left wrist, fracture, Dr. Mauri Pole    Past Gynecologic History:  Menarche: 12 Menstrual details: Lasts 3 - 5 days; long h/o dysmenorrhea Menses regular: yes History of OCP/HRT use: she declines OCP her mother had an aneurysm associated with estrogen History of Abnormal pap: no,  Last pap: 07/2019: NILIM, HPV negative History of STDs: reviewed  Sexually active: yes  Family History: Family History  Problem Relation Age of Onset   Aneurysm Mother    Heart disease Mother    Breast cancer Neg Hx     Social History: Social History   Socioeconomic History   Marital status: Married    Spouse name: Corene Cornea   Number of children: Not on file   Years of education: Not on file   Highest education level: Not on file  Occupational History   Not on file  Tobacco Use   Smoking status: Never   Smokeless tobacco: Never  Vaping Use   Vaping Use: Never used  Substance and Sexual Activity   Alcohol use: Yes    Comment: Occasional   Drug use: No   Sexual activity: Not on file  Other Topics Concern   Not on file  Social History Narrative   Lives in [place] with 3 children .  Dog in home.regular Exercise - walking 20-3mn daily   Social Determinants of Health   Financial Resource Strain: Not on file  Food Insecurity: Not on file  Transportation Needs: Not on file  Physical Activity: Not on file  Stress: Not on file  Social Connections: Not on file  Intimate Partner Violence: Not on file    Allergies: No Known Allergies  Current Medications: No current facility-administered medications for this encounter.   Current Outpatient Medications  Medication Sig Dispense Refill   atenolol (TENORMIN) 50 MG tablet TAKE 1 TABLET (50 MG TOTAL) BY MOUTH DAILY. 90 tablet 3   fexofenadine (ALLEGRA) 180 MG tablet Take 180 mg by mouth daily as needed (allergies).      gabapentin (NEURONTIN) 300 MG capsule Take 1 capsule (300 mg total) by mouth 3 (three) times daily as needed for up to 14 days (breakthrough pain). 40 capsule 0   ketotifen (ZADITOR) 0.025 % ophthalmic solution Place 1 drop into both eyes daily as needed (allergies).     Multiple Vitamin (MULTIVITAMIN) tablet Take 1 tablet by mouth daily.     Psyllium (METAMUCIL PO) Take 2 Scoops by mouth at bedtime.     triamterene-hydrochlorothiazide (MAXZIDE-25) 37.5-25 MG tablet Take 1 tablet by mouth daily.     docusate sodium (COLACE) 100 MG capsule Take 1 capsule (100 mg total) by mouth 2 (two) times daily. 10 capsule 0   ibuprofen (ADVIL) 600 MG tablet Take 1 tablet (600 mg total) by mouth every 6 (six) hours. 30 tablet 0   simethicone (MYLICON) 80 MG chewable tablet Chew 1 tablet (80 mg total) by mouth 4 (four) times daily as needed for flatulence. 30 tablet 0    Review of Systems General: negative for fevers, changes in weight or night sweats Skin: negative for changes in moles or sores or rash Eyes: negative for changes in vision HEENT: negative for change in hearing, tinnitus, voice changes Pulmonary: negative for dyspnea, orthopnea, productive cough, wheezing Cardiac: negative for palpitations, pain Gastrointestinal: negative for nausea, vomiting, constipation, diarrhea, hematemesis, hematochezia Genitourinary/Sexual: negative for dysuria, retention, hematuria, incontinence Ob/Gyn:  as per HPI Musculoskeletal: negative for pain, joint pain, back pain Hematology: negative for easy bruising, abnormal bleeding Neurologic/Psych: negative for headaches, seizures, paralysis, weakness, numbness   Objective:  Physical Examination:  BP 121/73 (BP Location: Right Arm)   Pulse 76   Temp 98.3 F (36.8 C) (Oral)   Resp 18   Ht 5' 7.5" (1.715 m)   Wt 69.9 kg   LMP 09/10/2021   SpO2 100%   BMI 23.76 kg/m   ECOG Performance Status: 0 - Asymptomatic  Exam per prior visit in 3/23 GENERAL:  Patient is a well appearing female in no acute distress HEENT:  PERRL, neck supple with midline trachea. Thyroid without masses.  NODES:  No cervical, supraclavicular, axillary, or inguinal lymphadenopathy palpated.  LUNGS:  Clear to auscultation bilaterally.   HEART:  Regular rate and rhythm.  ABDOMEN:  Soft, nontender. Nondistended, No ascites, masses, or hernias.  EXTREMITIES:  No peripheral edema.   NEURO:  Nonfocal. Well oriented.  Appropriate affect.  Pelvic chaperoned by CMA;  Vulva: normal appearing vulva  with no masses, tenderness or lesions; Vagina: normal vagina. Cervix: multiparous with nabothian cysts. Uterus ~10 cm irregular c/w leiomyoma. BME: smooth mass in the right adnexa nontender, limited mobility but not fixed. On the left the cystic mass was not palpated.  No uterosacral or cul-de-sac nodularity. Parametria smooth.  Rectal: deferred.   Lab Review Labs on site today: CA125 and HE4 ordered  Radiologic Imaging:  06/20/2021 Pelvic US FINDINGS: Uterus   Measurements: 9.3 x 6.5 x 7.9 cm = volume: 250 mL. Multiple uterine masses probably fibroids. The 2 most distinct masses are measured. Right fundal submucosal mass measuring 2.9 x 1.7 x 2.4 cm. Left lower uterine segment posterior subserosal mass measuring 4.1 x 3.1 x 3.7 cm.   Endometrium   Thickness: 15.4 mm. Abnormal complex fluid within the endometrial canal measuring up to 15.4 mm.   Right ovary   Measurements: 4.3 x 2.7 x 1.6 cm = volume: 9.2 mL. Complex cyst measuring 2.7 by 2.2 x 2.9 cm suggestive of hemorrhagic cyst.   Left ovary   Measurements: 8.6 x 6.3 x 7.4 cm = volume: 207.9 mL. Complex left adnexal cystic mass measuring 10.2 x 4.8 x 7.5 cm.   Other findings   Trace free fluid.   IMPRESSION: 1. Large complex left adnexal cystic mass measuring up to 10.2 cm, differential considerations include complex cystic ovarian mass versus dilated fallopian tube complicated by hemorrhage or  infected material. Surgical consultation is recommended. 2. Abnormal complex fluid distending the endometrial canal, raising concern for distal obstructing process. 3. 2.9 cm possible hemorrhagic cyst in the right ovary. Recommend 6-12 week sonographic follow-up for this finding 4. Trace free fluid in the pelvis. 5. Uterine fibroids  Labs     Chemistry      Component Value Date/Time   NA 140 09/20/2021 0630   K 4.0 09/20/2021 0630   CL 106 09/20/2021 0630   CO2 30 09/20/2021 0630   BUN 10 09/20/2021 0630   CREATININE 0.82 09/20/2021 0630      Component Value Date/Time   CALCIUM 8.4 (L) 09/20/2021 0630   ALKPHOS 39 09/20/2021 0630   AST 51 (H) 09/20/2021 0630   ALT 21 09/20/2021 0630   BILITOT 0.3 09/20/2021 0630       Lab Results  Component Value Date   WBC 5.0 09/20/2021   HGB 10.3 (L) 09/20/2021   HCT 31.5 (L) 09/20/2021   MCV 92.6 09/20/2021   PLT 180 09/20/2021   Normal TSH/T4 08/09/21    Assessment:  Jennifer Olsen is a 46 y.o. P39 female with complex cystic left adnexal mass ~10 cm on [place] that has gotten a bit larger over the past 6 weeks. She also has 7 cm multicystic right ovary that has gotten larger.  Menses heavier and has dysmenorrhea.  History of endometrial ablation in 2010 and has fluid collection in uterus probably due to adhesions.  CA125 is elevated to 167 and HE4 normal, which is most consistent with endometriosis.  Risk of malignancy low based on ROMA. Also has 10 week size irregular uterus with fibroids.    Medical co-morbidities complicating care: HTN, thyroid nodule. Plan:   Problem List Items Addressed This Visit       Endocrine   * (Principal) Left ovarian cyst   Relevant Medications   ibuprofen (ADVIL) 600 MG tablet   docusate sodium (COLACE) 100 MG capsule   simethicone (MYLICON) 80 MG chewable tablet   Other Relevant Orders   Low VTE Risk  (satisfies  pharmacologic VTE prophylaxis requirement) (Completed)   Admit to Inpatient (patient's  expected length of stay will be greater than 2 midnights or inpatient only procedure) (Completed)   CBC (Completed)   Comprehensive metabolic panel (Completed)   Discharge patient Discharge disposition: 01-Home or Self Care; Discharge patient date: 09/20/2021 (Completed)   Diet general   Increase activity slowly   May shower / Bathe   Driving Restrictions   Call MD for:  temperature >100.4   Call MD for:  persistant nausea and vomiting   Call MD for:  severe uncontrolled pain   Call MD for:  redness, tenderness, or signs of infection (pain, swelling, redness, odor or green/yellow discharge around incision site)   Call MD for:  difficulty breathing, headache or visual disturbances   Call MD for:  hives   Call MD for:  persistant dizziness or light-headedness   Call MD for:  extreme fatigue   Sexual Activity Restrictions   Right ovarian cyst   Relevant Medications   ibuprofen (ADVIL) 600 MG tablet   docusate sodium (COLACE) 100 MG capsule   simethicone (MYLICON) 80 MG chewable tablet   Other Relevant Orders   Low VTE Risk  (satisfies pharmacologic VTE prophylaxis requirement) (Completed)   Admit to Inpatient (patient's expected length of stay will be greater than 2 midnights or inpatient only procedure) (Completed)   CBC (Completed)   Comprehensive metabolic panel (Completed)   Discharge patient Discharge disposition: 01-Home or Self Care; Discharge patient date: 09/20/2021 (Completed)   Diet general   Increase activity slowly   May shower / Bathe   Driving Restrictions   Call MD for:  temperature >100.4   Call MD for:  persistant nausea and vomiting   Call MD for:  severe uncontrolled pain   Call MD for:  redness, tenderness, or signs of infection (pain, swelling, redness, odor or green/yellow discharge around incision site)   Call MD for:  difficulty breathing, headache or visual disturbances   Call MD for:  hives   Call MD for:  persistant dizziness or light-headedness   Call MD  for:  extreme fatigue   Sexual Activity Restrictions     Other   Menorrhagia with regular cycle - Primary   Relevant Medications   ibuprofen (ADVIL) 600 MG tablet   docusate sodium (COLACE) 100 MG capsule   simethicone (MYLICON) 80 MG chewable tablet   Other Relevant Orders   Low VTE Risk  (satisfies pharmacologic VTE prophylaxis requirement) (Completed)   Admit to Inpatient (patient's expected length of stay will be greater than 2 midnights or inpatient only procedure) (Completed)   CBC (Completed)   Comprehensive metabolic panel (Completed)   Discharge patient Discharge disposition: 01-Home or Self Care; Discharge patient date: 09/20/2021 (Completed)   Diet general   Increase activity slowly   May shower / Bathe   Driving Restrictions   Call MD for:  temperature >100.4   Call MD for:  persistant nausea and vomiting   Call MD for:  severe uncontrolled pain   Call MD for:  redness, tenderness, or signs of infection (pain, swelling, redness, odor or green/yellow discharge around incision site)   Call MD for:  difficulty breathing, headache or visual disturbances   Call MD for:  hives   Call MD for:  persistant dizziness or light-headedness   Call MD for:  extreme fatigue   Sexual Activity Restrictions   Endometriosis   Relevant Medications   ibuprofen (ADVIL) 600 MG tablet   docusate sodium (COLACE)  100 MG capsule   simethicone (MYLICON) 80 MG chewable tablet   Other Relevant Orders   Low VTE Risk  (satisfies pharmacologic VTE prophylaxis requirement) (Completed)   Admit to Inpatient (patient's expected length of stay will be greater than 2 midnights or inpatient only procedure) (Completed)   CBC (Completed)   Comprehensive metabolic panel (Completed)   Discharge patient Discharge disposition: 01-Home or Self Care; Discharge patient date: 09/20/2021 (Completed)   Diet general   Increase activity slowly   May shower / Bathe   Driving Restrictions   Call MD for:  temperature  >100.4   Call MD for:  persistant nausea and vomiting   Call MD for:  severe uncontrolled pain   Call MD for:  redness, tenderness, or signs of infection (pain, swelling, redness, odor or green/yellow discharge around incision site)   Call MD for:  difficulty breathing, headache or visual disturbances   Call MD for:  hives   Call MD for:  persistant dizziness or light-headedness   Call MD for:  extreme fatigue   Sexual Activity Restrictions   Discussed that Dr Glennon Mac, Theora Gianotti and I believe that laparoscopic surgery is indicated in view of the 10 cm enlarging left ovarian complex cystic mass. This is not likely to resolve spontaneously.  Most likely this is benign, but we cannot be certain without removing it.  The other ovary is also 7 cm and multicystic.  She also has heavy menses, and although she would like to keep the uterus if it is fairly normal, she is having heavier periods and dysmenorrhea.  Discussed that if the uterus does not look too abnormal we could do a D&C to assure that there is no pathology and leave this in place.  We discussed that it would be desirable to preserve at least one ovary to avoid surgical menopause, but may not be best if she has significant endometriosis.  Discussed possible need for estrogen replacement if both ovaries removed.  Also advise removal of both both tubes to prevent future tubal cancer, since she has satisfied parity.  Also discussed that if frozen section shows cancer we would do staging including possible pelvic/aortic node biopsies .  She is very interested in maximizing the cosmetic results of the surgery and told her that we hoped to be able to do the surgery laparoscopically. However, it is possible that a laparotomy could be required.   The consent form included removal of the left adnexal mass, both fallopian tubes, possible D&C, possible hysterectomy, possible BSO, possible surgical staging including pelvic/PA node biopsies, possible  laparotomy.  She was comfortable moving ahead with the surgery on 09/18/21 and this will be done by Dr Fransisca Connors and Dr Glennon Mac.  I spoke to her husband Dr Leotis Pain on the phone later and reviewed the surgical plan and he was in agreement and felt she should have a hysterectomy as well, even if she has benign disease.   The risks of surgery were discussed in detail and she understands these to include infection; wound separation; hernia; injury to adjacent organs such as bowel, bladder, blood vessels, ureters and nerves; bleeding which may require blood transfusion; anesthesia risk; thromboembolic events; possible death; unforeseen complications; possible need for re-exploration; medical complications such as heart attack, stroke, pleural effusion and pneumonia; and, if staging performed the risk of lymphedema and lymphocyst.  The patient will receive DVT and antibiotic prophylaxis as indicated.  She voiced a clear understanding.  She had the opportunity to ask questions and  written informed consent was obtained today.   The patient's diagnosis, an outline of the further diagnostic and laboratory studies which will be required, the recommendation, and alternatives were discussed.  All questions were answered to the patient's satisfaction.  Mellody Drown, MD  Addendum: did not discuss with patient, but on further review will order CT scan A/P and repeat CA125 prior to surgery.   CC:  Prentice Docker, MD Einar Pheasant, MD

## 2021-10-17 ENCOUNTER — Ambulatory Visit
Admission: RE | Admit: 2021-10-17 | Discharge: 2021-10-17 | Disposition: A | Discharge status: Home / Self Care | Payer: 59 | Source: Ambulatory Visit | Attending: Internal Medicine | Admitting: Internal Medicine

## 2021-10-17 DIAGNOSIS — R928 Other abnormal and inconclusive findings on diagnostic imaging of breast: Secondary | ICD-10-CM | POA: Insufficient documentation

## 2021-10-17 DIAGNOSIS — Z9882 Breast implant status: Secondary | ICD-10-CM | POA: Diagnosis not present

## 2021-10-17 DIAGNOSIS — T8549XA Other mechanical complication of breast prosthesis and implant, initial encounter: Secondary | ICD-10-CM | POA: Diagnosis not present

## 2021-10-17 IMAGING — MR MR BREAST BILAT W/O CM
6 of 10 series · 35 of 48 positions shown · non-contrast
Comparison: Previous mammograms, the most recent dated [DATE].

CLINICAL DATA: Extracapsular silicone demonstrated on the right on
a bilateral screening mammogram dated [DATE]. The patient has
bilateral retropectoral silicone breast implants placed in [HO].
Asymptomatic.

EXAM:
BILATERAL BREAST MRI  WITHOUT CONTRAST
TECHNIQUE: Multiplanar, multisequence MR images of both breasts without
contrast. The exam was used to evaluate implant integrity. Because
of the sequences used and the lack of intravenous contrast, this
study is not diagnostic for breast lesions.

[Series 2: stir_ws_tra_silicone_bilateral · axial · B · 4.0mm · 0.94mm/px · z∈[-78,+90]mm · 4 of 43 slices shown]
[im 1/43]
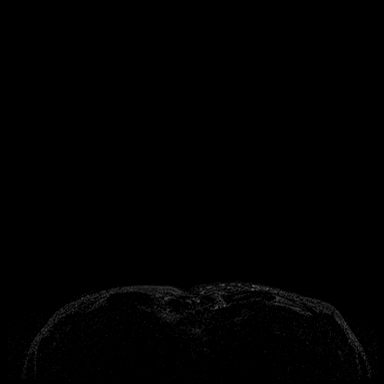
[im 15/43]
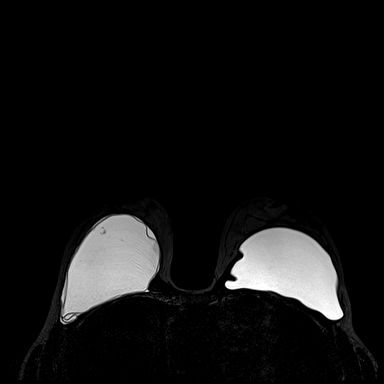
[im 29/43]
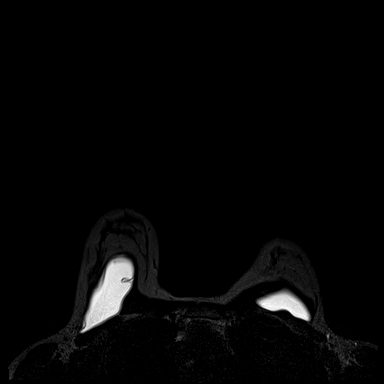
[im 43/43]
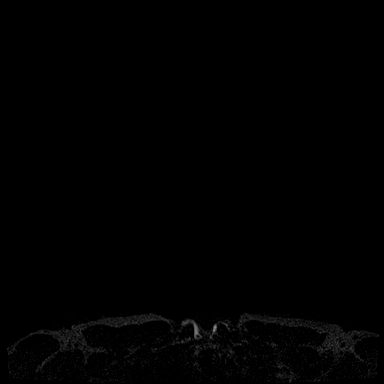

[Series 3: T2 fat-sat · axial · B · 3.0mm · 1.02mm/px · z∈[-77,+89]mm · 5 of 47 slices shown]
[im 1/47]
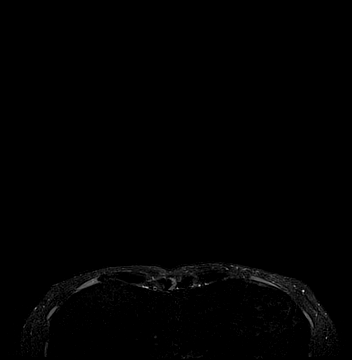
[im 12/47]
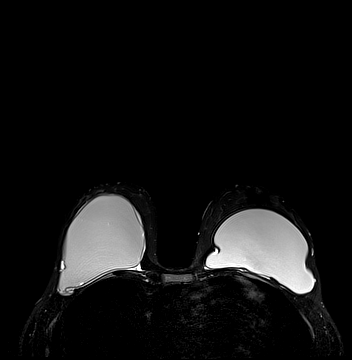
[im 24/47]
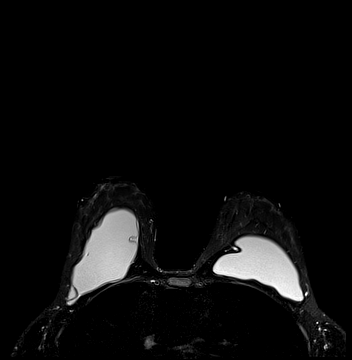
[im 35/47]
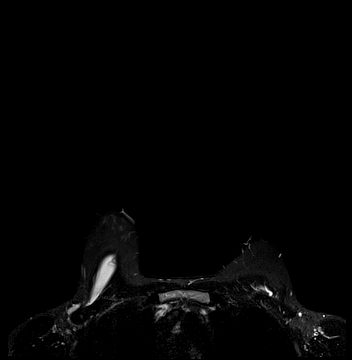
[im 47/47]
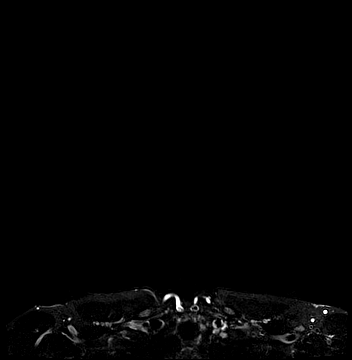

[Series 4: T1 dynamic · axial · B · 1.5mm · 0.94mm/px · z∈[-89,+101]mm · 8 of 128 slices shown (1 of 3)]
[im 1/128]
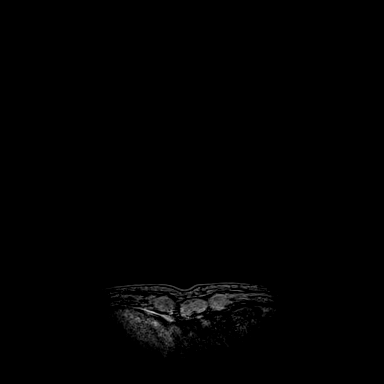
[im 15/128]
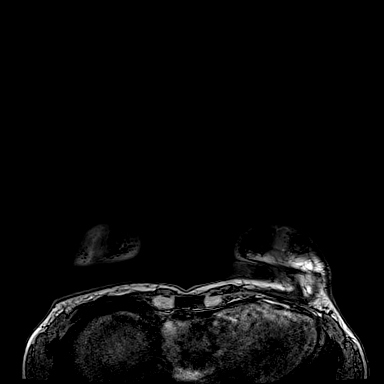
[im 43/128]
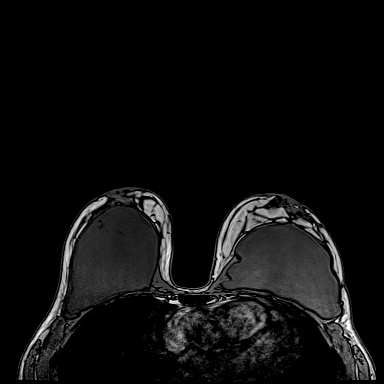
[im 57/128]
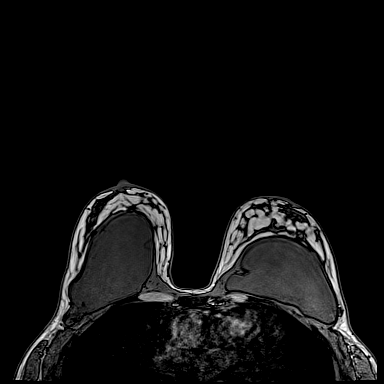
[im 71/128]
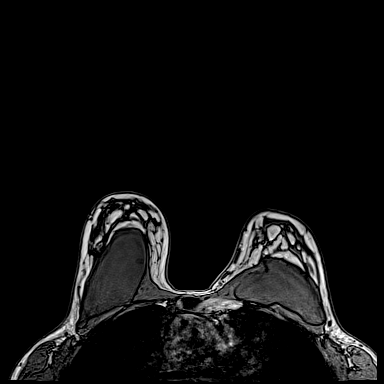
[im 85/128]
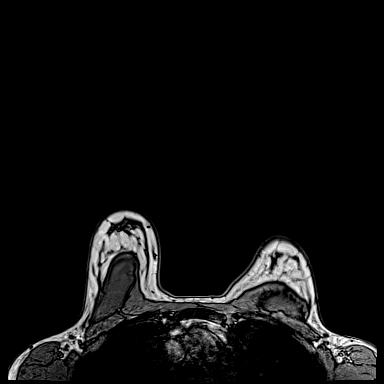
[im 113/128]
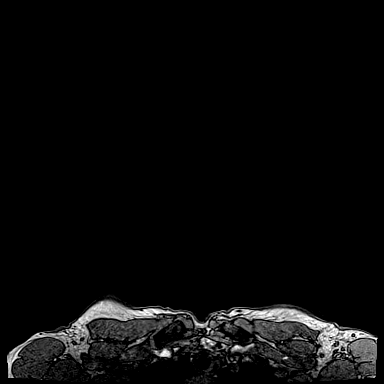
[im 128/128]
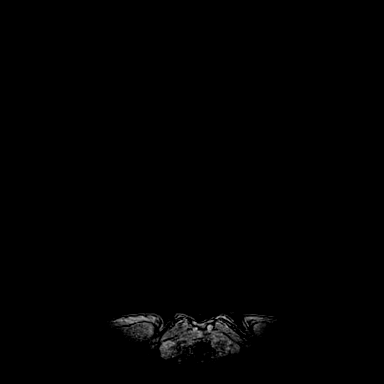

[Series 5: T1 dynamic · sagittal · B · 1.5mm · 0.78mm/px · 9 of 112 slices shown (2 of 3)]
[im 1/112]
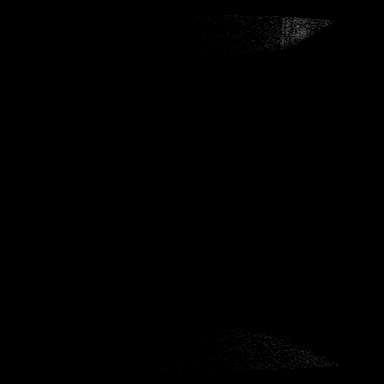
[im 14/112]
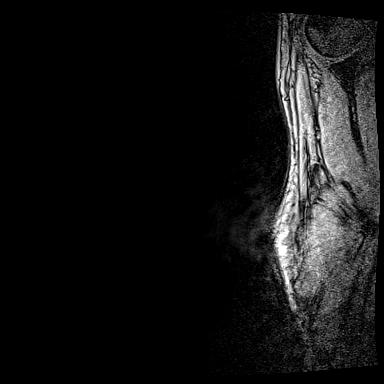
[im 28/112]
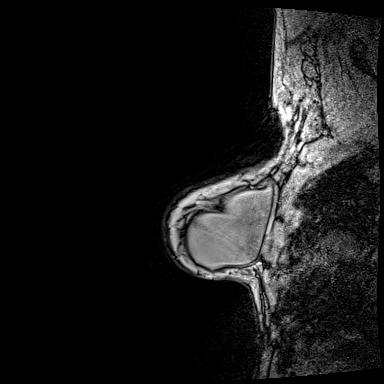
[im 42/112]
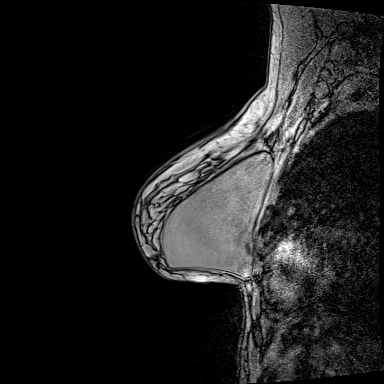
[im 56/112]
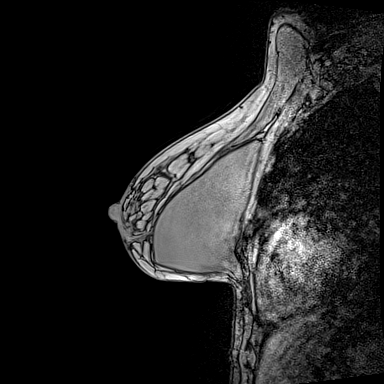
[im 70/112]
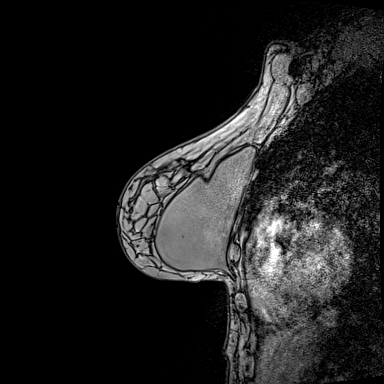
[im 84/112]
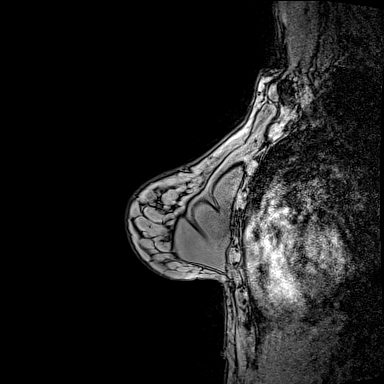
[im 98/112]
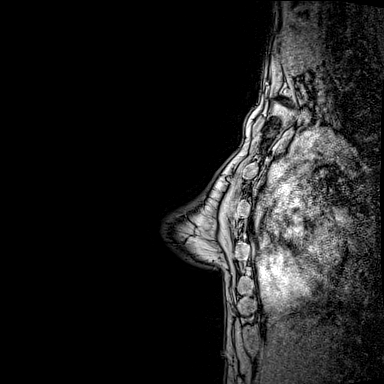
[im 112/112]
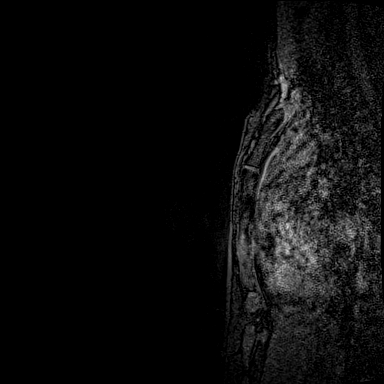

[Series 6: T1 dynamic · sagittal · B · 1.5mm · 0.78mm/px · 8 of 112 slices shown (3 of 3)]
[im 1/112]
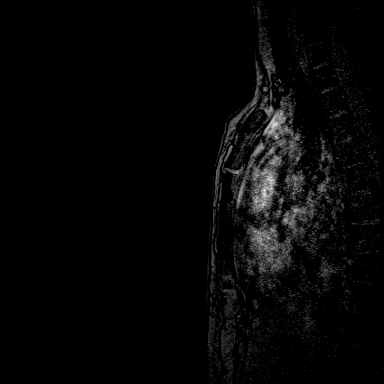
[im 14/112]
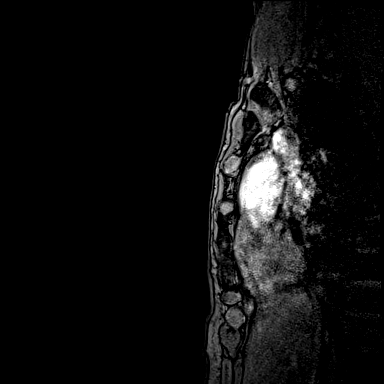
[im 28/112]
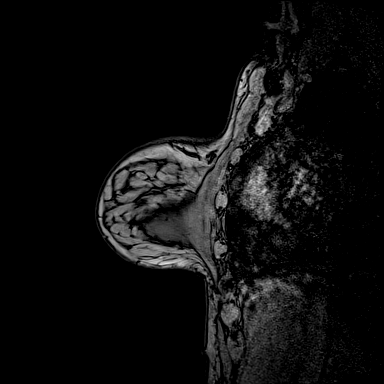
[im 42/112]
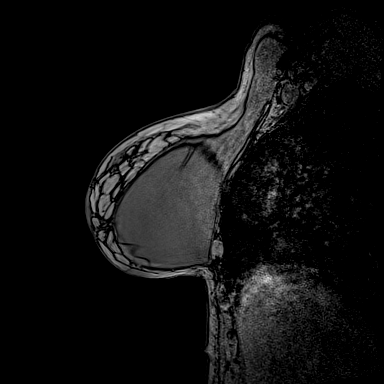
[im 70/112]
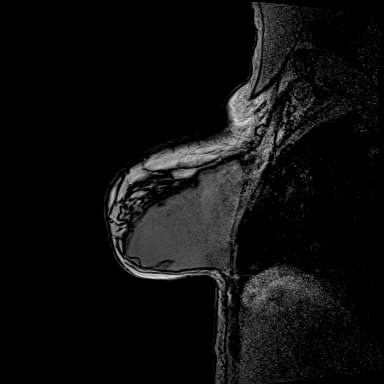
[im 84/112]
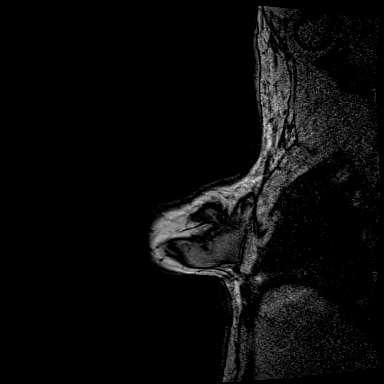
[im 98/112]
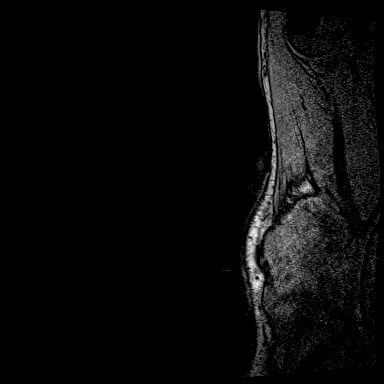
[im 112/112]
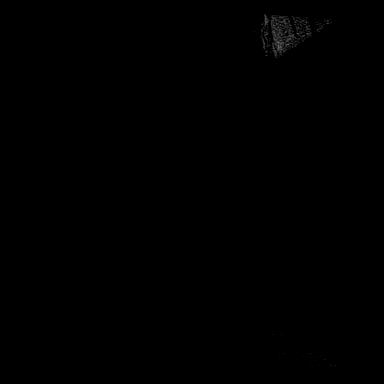

[Series 7: stir_ws_sag_silicone_left · sagittal · B · 4.0mm · 0.94mm/px · 1 of 31 slices shown]
[im 1/31]
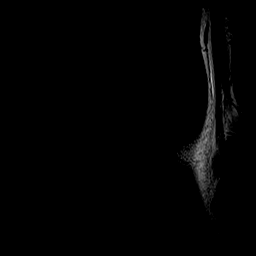

[35 of 48 positions shown; findings below may reference images not displayed]

Three-dimensional MR images were rendered by post-processing of the
original MR data on an independent workstation. The
three-dimensional MR images were interpreted, and findings are
reported in the following complete MRI report for this study. Three
dimensional images were evaluated at the independent [employer]
workstation
FINDINGS: Breast composition: c. Heterogeneous fibroglandular tissue.

Right breast: Retropectoral implant rupture with associated multiple
areas of silicone surrounding the ruptured implant.

Left breast: Intact retropectoral silicone implant.

Ancillary findings: None.
IMPRESSION: 1. Ruptured right retropectoral silicone implant with multiple areas
of silicone surrounding the ruptured implant.
2. Intact left retropectoral silicone implant.

RECOMMENDATION:
1. Clinical follow-up.
2. Bilateral screening mammogram in [DATE] when due.

BI-RADS CATEGORY  2: Benign.

## 2021-10-23 ENCOUNTER — Ambulatory Visit: Payer: 59

## 2021-11-04 ENCOUNTER — Encounter: Payer: Self-pay | Admitting: Gastroenterology

## 2021-11-04 ENCOUNTER — Ambulatory Visit: Payer: 59 | Admitting: Gastroenterology

## 2021-11-04 ENCOUNTER — Other Ambulatory Visit: Payer: Self-pay

## 2021-11-04 VITALS — BP 132/86 | HR 69 | Temp 98.2°F | Wt 157.0 lb

## 2021-11-04 DIAGNOSIS — K648 Other hemorrhoids: Secondary | ICD-10-CM | POA: Diagnosis not present

## 2021-11-13 ENCOUNTER — Ambulatory Visit: Payer: 59

## 2021-11-26 ENCOUNTER — Other Ambulatory Visit: Payer: Self-pay | Admitting: Internal Medicine

## 2021-11-26 ENCOUNTER — Other Ambulatory Visit: Payer: Self-pay

## 2021-11-26 DIAGNOSIS — K649 Unspecified hemorrhoids: Secondary | ICD-10-CM

## 2021-11-26 MED ORDER — HYDROCORTISONE 2.5 % EX CREA
TOPICAL_CREAM | CUTANEOUS | 3 refills | Status: DC
  Filled 2021-11-26: qty 28, 10d supply, fill #0
  Filled 2022-04-15: qty 30, 30d supply, fill #1
  Filled 2022-08-20: qty 30, 30d supply, fill #2

## 2021-11-26 MED ORDER — PREMARIN 0.625 MG/GM VA CREA
TOPICAL_CREAM | VAGINAL | 0 refills | Status: DC
  Filled 2021-11-26: qty 30, 21d supply, fill #0

## 2021-11-26 MED FILL — Atenolol Tab 50 MG: ORAL | 90 days supply | Qty: 90 | Fill #1 | Status: AC

## 2021-11-28 ENCOUNTER — Other Ambulatory Visit: Payer: Self-pay

## 2021-11-28 MED FILL — Triamterene & Hydrochlorothiazide Tab 37.5-25 MG: ORAL | 90 days supply | Qty: 90 | Fill #0 | Status: AC

## 2021-11-30 ENCOUNTER — Other Ambulatory Visit: Payer: Self-pay

## 2021-12-05 ENCOUNTER — Encounter: Payer: Self-pay | Admitting: Gastroenterology

## 2021-12-05 ENCOUNTER — Ambulatory Visit: Payer: 59 | Admitting: Gastroenterology

## 2021-12-05 VITALS — BP 104/69 | HR 70 | Temp 99.0°F | Wt 161.0 lb

## 2021-12-05 DIAGNOSIS — K648 Other hemorrhoids: Secondary | ICD-10-CM

## 2021-12-05 NOTE — Progress Notes (Signed)
Patient follow-ups today for banding of hemorrhoids    Summary of history :     She was seen by Dr. Allen Norris in the past for hemorrhoid issues.  She has had a colonoscopy in April 2023 she was found to have nonbleeding internal hemorrhoids during retroflexion grade 2.  She is here today to for banding.  Failed conservative management.  At her initial visit her main symptoms are perianal itching. She had grade 2 hemorrhoids.     First round: 08/27/2021: Right anterior column banded  Second round: 11/04/2021 left lateral column banded   Interval history   11/04/2021-12/05/2021   No rectal bleeding occasional perianal itching. Digital rectal exam performed in the presence of a chaperone. External anal findings: none Internal findings: , No masses, no blood on glove noticed.    PROCEDURE NOTE: The patient presents with symptomatic grade 1 hemorrhoids, unresponsive to maximal medical therapy, requesting rubber band ligation of his/her hemorrhoidal disease.  All risks, benefits and alternative forms of therapy were described and informed consent was obtained.  In the Left Lateral Decubitus position (if anoscopy is performed) anoscopic examination revealed grade 1 hemorrhoids in the RP position(s).   The decision was made to band the RP internal hemorrhoid, and the [place] was used to perform band ligation without complication.  Digital anorectal examination was then performed to assure proper positioning of the band, and to adjust the banded tissue as required.  The patient was discharged home without pain or other issues.  Dietary and behavioral recommendations were given and (if necessary - prescriptions were given), along with follow-up instructions.  The patient will return as needed for follow-up and possible additional banding as required.  No complications were encountered and the patient tolerated the procedure well.   Plan:  Avoid constipation.  Discussed about sitz bath  use of Desitin cream as a barrier if there is any perianal itching.  Follow-up: As needed  Dr Jonathon Bellows MD,MRCP [facility]) Gastroenterology/Hepatology Pager: [phone]

## 2021-12-05 NOTE — Patient Instructions (Signed)
Please let us know if you have any bleeding.

## 2022-01-21 ENCOUNTER — Other Ambulatory Visit: Payer: Self-pay

## 2022-01-21 MED ORDER — PROMETHAZINE HCL 25 MG PO TABS
ORAL_TABLET | ORAL | 1 refills | Status: DC
  Filled 2022-01-21: qty 10, 3d supply, fill #0

## 2022-01-21 MED ORDER — CEPHALEXIN 500 MG PO CAPS
ORAL_CAPSULE | ORAL | 0 refills | Status: DC
  Filled 2022-01-21: qty 28, 7d supply, fill #0

## 2022-01-21 MED ORDER — HYDROCODONE-ACETAMINOPHEN 5-325 MG PO TABS
ORAL_TABLET | ORAL | 0 refills | Status: DC
  Filled 2022-01-21: qty 42, 7d supply, fill #0

## 2022-02-11 ENCOUNTER — Encounter: Payer: Self-pay | Admitting: Internal Medicine

## 2022-02-11 ENCOUNTER — Ambulatory Visit: Payer: 59 | Admitting: Internal Medicine

## 2022-02-11 DIAGNOSIS — Z1211 Encounter for screening for malignant neoplasm of colon: Secondary | ICD-10-CM

## 2022-02-11 DIAGNOSIS — Z90710 Acquired absence of both cervix and uterus: Secondary | ICD-10-CM

## 2022-02-11 DIAGNOSIS — I10 Essential (primary) hypertension: Secondary | ICD-10-CM | POA: Diagnosis not present

## 2022-02-11 DIAGNOSIS — E0789 Other specified disorders of thyroid: Secondary | ICD-10-CM | POA: Diagnosis not present

## 2022-02-11 NOTE — Progress Notes (Signed)
Patient ID: Jennifer Olsen, female   DOB: December 18, 1975, 46 y.o.   MRN: [id number]   Subjective:    Patient ID: Jennifer Olsen, female    DOB: 1976/04/02, 47 y.o.   MRN: [id number]   Patient here for  Chief Complaint  Patient presents with   Follow-up    6 month follow up   .   HPI Here to follow up regarding her blood pressure and increased stress.  Is s/p hemorrhoidal banding.  Also recent TAH/LSO and RSO.  Is doing well s/p surgery.  Staying active.  No chest pain or sob reported.  No abdominal pain.  Bowels moving.     Past Medical History:  Diagnosis Date   Hypertension    s/p renal artery doppler which was normal   Persistent headaches    Thyroid nodule    Past Surgical History:  Procedure Laterality Date   ABDOMINAL HYSTERECTOMY  09/18/2021   Procedure: HYSTERECTOMY ABDOMINAL;  Surgeon: Mellody Drown, MD;  Location: [facility];  Service: Gynecology;;   AUGMENTATION MAMMAPLASTY Bilateral 05/2015   saline   BILATERAL SALPINGECTOMY Bilateral 09/18/2021   Procedure: OPEN BILATERAL SALPINGECTOMY;  Surgeon: Mellody Drown, MD;  Location: [facility];  Service: Gynecology;  Laterality: Bilateral;   COLONOSCOPY WITH PROPOFOL N/A 08/19/2021   Procedure: COLONOSCOPY WITH PROPOFOL;  Surgeon: Lucilla Lame, MD;  Location: [place];  Service: Endoscopy;  Laterality: N/A;   CYSTOSCOPY  09/18/2021   Procedure: CYSTOSCOPY;  Surgeon: Mellody Drown, MD;  Location: [facility];  Service: Gynecology;;   DILATION AND CURETTAGE OF UTERUS N/A 09/18/2021   Procedure: DILATATION AND CURETTAGE;  Surgeon: Mellody Drown, MD;  Location: [facility];  Service: Gynecology;  Laterality: N/A;   FOOT SURGERY     bilateral, bunions, Dr. Elvina Mattes   HYSTEROSCOPY  09/18/2021   Procedure: HYSTEROSCOPY;  Surgeon: Mellody Drown, MD;  Location: [facility];  Service: Gynecology;;   LAPAROSCOPIC LYSIS OF ADHESIONS  09/18/2021   Procedure: LAPAROSCOPIC LYSIS OF ADHESIONS;  Surgeon: Mellody Drown, MD;  Location:  [facility];  Service: Gynecology;;   LAPAROSCOPY  09/18/2021   Procedure: LAPAROSCOPY OPERATIVE;  Surgeon: Mellody Drown, MD;  Location: [facility];  Service: Gynecology;;   [facility] ABLATION  2010   Dr. Amalia Hailey   SALPINGOOPHORECTOMY Left 09/18/2021   Procedure: OPEN SALPINGO OOPHORECTOMY;  Surgeon: Mellody Drown, MD;  Location: [facility];  Service: Gynecology;  Laterality: Left;   WRIST SURGERY  2011   left wrist, fracture, Dr. Mauri Pole   Family History  Problem Relation Age of Onset   Aneurysm Mother    Heart disease Mother    Breast cancer Neg Hx    Social History   Socioeconomic History   Marital status: Unknown    Spouse name: Corene Cornea   Number of children: Not on file   Years of education: Not on file   Highest education level: Not on file  Occupational History   Not on file  Tobacco Use   Smoking status: Never   Smokeless tobacco: Never  Vaping Use   Vaping Use: Never used  Substance and Sexual Activity   Alcohol use: Yes    Comment: Occasional   Drug use: No   Sexual activity: Not on file  Other Topics Concern   Not on file  Social History Narrative   Lives in [place] with 3 children .  Dog in home.regular Exercise - walking 20-61mn daily   Social Determinants of Health   Financial Resource Strain: Not on file  Food  Insecurity: Not on file  Transportation Needs: Not on file  Physical Activity: Not on file  Stress: Not on file  Social Connections: Not on file     Review of Systems  Constitutional:  Negative for appetite change and unexpected weight change.  HENT:  Negative for congestion and sinus pressure.   Respiratory:  Negative for cough, chest tightness and shortness of breath.   Cardiovascular:  Negative for chest pain, palpitations and leg swelling.  Gastrointestinal:  Negative for abdominal pain, diarrhea, nausea and vomiting.  Genitourinary:  Negative for difficulty urinating and dysuria.  Musculoskeletal:  Negative for joint swelling and  myalgias.  Skin:  Negative for color change and rash.  Neurological:  Negative for dizziness, light-headedness and headaches.  Psychiatric/Behavioral:  Negative for agitation and dysphoric mood.        Objective:     BP 100/62 (BP Location: Left Arm, Patient Position: Sitting, Cuff Size: Normal)   Pulse 63   Temp 97.6 F (36.4 C) (Oral)   Ht '5\' 7"'$  (1.702 m)   Wt 164 lb 12.8 oz (74.8 kg)   SpO2 97%   BMI 25.81 kg/m  Wt Readings from Last 3 Encounters:  02/11/22 164 lb 12.8 oz (74.8 kg)  12/05/21 161 lb (73 kg)  11/04/21 157 lb (71.2 kg)    Physical Exam Vitals reviewed.  Constitutional:      General: She is not in acute distress.    Appearance: Normal appearance.  HENT:     Head: Normocephalic and atraumatic.     Right Ear: External ear normal.     Left Ear: External ear normal.  Eyes:     General: No scleral icterus.       Right eye: No discharge.        Left eye: No discharge.     Conjunctiva/sclera: Conjunctivae normal.  Neck:     Thyroid: No thyromegaly.  Cardiovascular:     Rate and Rhythm: Normal rate and regular rhythm.  Pulmonary:     Effort: No respiratory distress.     Breath sounds: Normal breath sounds. No wheezing.  Abdominal:     General: Bowel sounds are normal.     Palpations: Abdomen is soft.     Tenderness: There is no abdominal tenderness.  Musculoskeletal:        General: No swelling or tenderness.     Cervical back: Neck supple. No tenderness.  Lymphadenopathy:     Cervical: No cervical adenopathy.  Skin:    Findings: No erythema or rash.  Neurological:     Mental Status: She is alert.  Psychiatric:        Mood and Affect: Mood normal.        Behavior: Behavior normal.      Outpatient Encounter Medications as of 02/11/2022  Medication Sig   atenolol (TENORMIN) 50 MG tablet TAKE 1 TABLET (50 MG TOTAL) BY MOUTH DAILY.   cephALEXin (KEFLEX) 500 MG capsule Take 2 capsules by mouth every 12 hours; Start after surgery and FINISH entire  prescription   docusate sodium (COLACE) 100 MG capsule Take 1 capsule (100 mg total) by mouth 2 (two) times daily.   fexofenadine (ALLEGRA) 180 MG tablet Take 180 mg by mouth daily as needed (allergies).   HYDROcodone-acetaminophen (NORCO/VICODIN) 5-325 MG tablet Take 1 tablet by mouth every 4-6 hours as needed for pain; Take as directed for post-surgical pain   hydrocortisone 2.5 % cream Apply to affected area as directed   Multiple Vitamin (MULTIVITAMIN)  tablet Take 1 tablet by mouth daily.   Psyllium (METAMUCIL PO) Take 2 Scoops by mouth at bedtime.   simethicone (MYLICON) 80 MG chewable tablet Chew 1 tablet (80 mg total) by mouth 4 (four) times daily as needed for flatulence.   triamterene-hydrochlorothiazide (MAXZIDE-25) 37.5-25 MG tablet TAKE 1 TABLET BY MOUTH DAILY.   gabapentin (NEURONTIN) 300 MG capsule Take 1 capsule (300 mg total) by mouth 3 (three) times daily as needed for up to 14 days (breakthrough pain).   [DISCONTINUED] conjugated estrogens (PREMARIN) vaginal cream Place 1 g vaginally at bedtime for 21 days 1 applicator at bedtime for 3 weeks (Patient not taking: Reported on 02/11/2022)   [DISCONTINUED] ibuprofen (ADVIL) 600 MG tablet Take 1 tablet (600 mg total) by mouth every 6 (six) hours. (Patient not taking: Reported on 02/11/2022)   [DISCONTINUED] ketotifen (ZADITOR) 0.025 % ophthalmic solution Apply to eye. (Patient not taking: Reported on 02/11/2022)   [DISCONTINUED] promethazine (PHENERGAN) 25 MG tablet Take 0.5-1 tablets by mouth every 6-8 hours as needed for nausea; May cause drowsiness (Patient not taking: Reported on 02/11/2022)   No facility-administered encounter medications on file as of 02/11/2022.     Lab Results  Component Value Date   WBC 5.0 09/20/2021   HGB 10.3 (L) 09/20/2021   HCT 31.5 (L) 09/20/2021   PLT 180 09/20/2021   GLUCOSE 99 09/20/2021   CHOL 145 08/09/2021   TRIG 56.0 08/09/2021   HDL 56.50 08/09/2021   LDLCALC 77 08/09/2021   ALT 21  09/20/2021   AST 51 (H) 09/20/2021   NA 140 09/20/2021   K 4.0 09/20/2021   CL 106 09/20/2021   CREATININE 0.82 09/20/2021   BUN 10 09/20/2021   CO2 30 09/20/2021   TSH 1.60 08/09/2021   MICROALBUR 1.7 01/18/2016    MR BREAST BILATERAL WO CONTRAST  Result Date: 10/17/2021 CLINICAL DATA:  Extracapsular silicone demonstrated on the right on a bilateral screening mammogram dated 08/02/2021. The patient has bilateral retropectoral silicone breast implants placed in 2017. Asymptomatic. EXAM: BILATERAL BREAST MRI  WITHOUT CONTRAST TECHNIQUE: Multiplanar, multisequence MR images of both breasts without contrast. The exam was used to evaluate implant integrity. Because of the sequences used and the lack of intravenous contrast, this study is not diagnostic for breast lesions. Three-dimensional MR images were rendered by post-processing of the original MR data on an independent workstation. The three-dimensional MR images were interpreted, and findings are reported in the following complete MRI report for this study. Three dimensional images were evaluated at the independent DynaCad workstation COMPARISON:  Previous mammograms, the most recent dated 08/02/2021. FINDINGS: Breast composition: c. Heterogeneous fibroglandular tissue. Right breast: Retropectoral implant rupture with associated multiple areas of silicone surrounding the ruptured implant. Left breast: Intact retropectoral silicone implant. Ancillary findings: None. IMPRESSION: 1. Ruptured right retropectoral silicone implant with multiple areas of silicone surrounding the ruptured implant. 2. Intact left retropectoral silicone implant. RECOMMENDATION: 1. Clinical follow-up. 2. Bilateral screening mammogram in March 2024 when due. BI-RADS CATEGORY  2: Benign. Electronically Signed   By: Claudie Revering M.D.   On: 10/17/2021 12:53      Assessment & Plan:   Problem List Items Addressed This Visit     Colon cancer screening    Colonoscopy 08/19/21 -  internal hemorrhoids.  F/u colonoscopy in 10 years.       Hypertension    Blood pressure doing well on tenormin and triam/hctz.  Follow pressures.  Follow metabolic panel.       Status  post abdominal hysterectomy    Doing well s/p surgery.  Follow.       Thyroid fullness    Saw Dr Gabriel Carina 03/06/21 - had f/u ultrasound - diffuse enlargement of thyroid  Two thyroid nodules, measuring up to 0.9 cm in right lobe and 1.0 cm in left lobe. Both have a fairly unremarkable appearance and are stable in size in comparison to a prior ultrasound on 02/2020.  No role for routine follow up neck imaging.           Einar Pheasant, MD

## 2022-02-16 ENCOUNTER — Encounter: Payer: Self-pay | Admitting: Internal Medicine

## 2022-02-16 NOTE — Assessment & Plan Note (Signed)
Blood pressure doing well on tenormin and triam/hctz.  Follow pressures.  Follow metabolic panel.

## 2022-02-16 NOTE — Assessment & Plan Note (Signed)
Doing well s/p surgery.  Follow.

## 2022-02-16 NOTE — Assessment & Plan Note (Signed)
Colonoscopy 08/19/21 - internal hemorrhoids.  F/u colonoscopy in 10 years.

## 2022-02-16 NOTE — Assessment & Plan Note (Signed)
Saw Dr Gabriel Carina 03/06/21 - had f/u ultrasound - diffuse enlargement of thyroid  Two thyroid nodules, measuring up to 0.9 cm in right lobe and 1.0 cm in left lobe. Both have a fairly unremarkable appearance and are stable in size in comparison to a prior ultrasound on 02/2020.  No role for routine follow up neck imaging.

## 2022-02-27 ENCOUNTER — Other Ambulatory Visit: Payer: Self-pay

## 2022-02-27 MED ORDER — PROMETHAZINE HCL 25 MG PO TABS
ORAL_TABLET | ORAL | 1 refills | Status: DC
  Filled 2022-02-27: qty 10, 3d supply, fill #0
  Filled 2022-04-15: qty 10, 3d supply, fill #1

## 2022-02-27 MED ORDER — HYDROCODONE-ACETAMINOPHEN 5-325 MG PO TABS
ORAL_TABLET | ORAL | 0 refills | Status: DC
  Filled 2022-02-27: qty 42, 7d supply, fill #0

## 2022-02-27 MED ORDER — CEPHALEXIN 500 MG PO CAPS
ORAL_CAPSULE | ORAL | 1 refills | Status: DC
  Filled 2022-02-27: qty 28, 7d supply, fill #0
  Filled 2022-03-11: qty 28, 7d supply, fill #1

## 2022-02-27 MED FILL — Triamterene & Hydrochlorothiazide Tab 37.5-25 MG: ORAL | 90 days supply | Qty: 90 | Fill #1 | Status: AC

## 2022-02-27 MED FILL — Atenolol Tab 50 MG: ORAL | 90 days supply | Qty: 90 | Fill #2 | Status: AC

## 2022-03-11 ENCOUNTER — Other Ambulatory Visit: Payer: Self-pay

## 2022-03-31 ENCOUNTER — Other Ambulatory Visit: Payer: Self-pay

## 2022-03-31 MED ORDER — FLUCONAZOLE 150 MG PO TABS
ORAL_TABLET | ORAL | 1 refills | Status: DC
  Filled 2022-03-31: qty 1, 1d supply, fill #0
  Filled 2022-04-15: qty 1, 1d supply, fill #1

## 2022-04-15 ENCOUNTER — Other Ambulatory Visit: Payer: Self-pay

## 2022-05-27 MED FILL — Atenolol Tab 50 MG: ORAL | 90 days supply | Qty: 90 | Fill #3 | Status: AC

## 2022-05-27 MED FILL — Triamterene & Hydrochlorothiazide Tab 37.5-25 MG: ORAL | 90 days supply | Qty: 90 | Fill #2 | Status: AC

## 2022-06-16 ENCOUNTER — Other Ambulatory Visit: Payer: Self-pay

## 2022-06-16 MED ORDER — AZITHROMYCIN 250 MG PO TABS
ORAL_TABLET | ORAL | 1 refills | Status: DC
  Filled 2022-06-16: qty 6, 5d supply, fill #0

## 2022-06-16 MED ORDER — FLUCONAZOLE 150 MG PO TABS
150.0000 mg | ORAL_TABLET | Freq: Once | ORAL | 1 refills | Status: AC
  Filled 2022-06-16: qty 1, 1d supply, fill #0

## 2022-06-17 ENCOUNTER — Other Ambulatory Visit: Payer: Self-pay

## 2022-06-17 MED ORDER — CIPROFLOXACIN HCL 500 MG PO TABS
500.0000 mg | ORAL_TABLET | Freq: Two times a day (BID) | ORAL | 0 refills | Status: DC
  Filled 2022-06-17: qty 6, 3d supply, fill #0

## 2022-07-07 ENCOUNTER — Other Ambulatory Visit: Payer: Self-pay

## 2022-07-07 DIAGNOSIS — M222X2 Patellofemoral disorders, left knee: Secondary | ICD-10-CM | POA: Diagnosis not present

## 2022-07-07 MED ORDER — CIPROFLOXACIN HCL 500 MG PO TABS
500.0000 mg | ORAL_TABLET | Freq: Two times a day (BID) | ORAL | 0 refills | Status: DC
  Filled 2022-07-07: qty 10, 5d supply, fill #0

## 2022-07-07 MED ORDER — MELOXICAM 7.5 MG PO TABS
7.5000 mg | ORAL_TABLET | Freq: Two times a day (BID) | ORAL | 1 refills | Status: DC
  Filled 2022-07-07: qty 60, 30d supply, fill #0
  Filled 2022-08-20: qty 60, 30d supply, fill #1

## 2022-07-07 MED ORDER — FLUCONAZOLE 150 MG PO TABS
150.0000 mg | ORAL_TABLET | ORAL | 1 refills | Status: DC | PRN
  Filled 2022-07-07: qty 1, 1d supply, fill #0

## 2022-07-14 ENCOUNTER — Encounter: Payer: Self-pay | Admitting: Internal Medicine

## 2022-07-14 DIAGNOSIS — M25562 Pain in left knee: Secondary | ICD-10-CM | POA: Insufficient documentation

## 2022-08-07 ENCOUNTER — Telehealth: Payer: Self-pay | Admitting: Internal Medicine

## 2022-08-07 DIAGNOSIS — Z13220 Encounter for screening for lipoid disorders: Secondary | ICD-10-CM

## 2022-08-07 DIAGNOSIS — E0789 Other specified disorders of thyroid: Secondary | ICD-10-CM

## 2022-08-07 DIAGNOSIS — I10 Essential (primary) hypertension: Secondary | ICD-10-CM

## 2022-08-07 NOTE — Addendum Note (Signed)
Addended by: Roetta Sessions D on: 08/07/2022 03:28 PM   Modules accepted: Orders

## 2022-08-07 NOTE — Telephone Encounter (Signed)
Patient has a lab appt 08/15/2022, there are No orders in.

## 2022-08-07 NOTE — Telephone Encounter (Signed)
Lab orders placed.  

## 2022-08-15 ENCOUNTER — Other Ambulatory Visit (INDEPENDENT_AMBULATORY_CARE_PROVIDER_SITE_OTHER): Payer: Commercial Managed Care - PPO

## 2022-08-15 DIAGNOSIS — E0789 Other specified disorders of thyroid: Secondary | ICD-10-CM

## 2022-08-15 DIAGNOSIS — I10 Essential (primary) hypertension: Secondary | ICD-10-CM | POA: Diagnosis not present

## 2022-08-15 DIAGNOSIS — Z13220 Encounter for screening for lipoid disorders: Secondary | ICD-10-CM | POA: Diagnosis not present

## 2022-08-15 LAB — COMPREHENSIVE METABOLIC PANEL
ALT: 14 U/L (ref 0–35)
AST: 21 U/L (ref 0–37)
Albumin: 4.5 g/dL (ref 3.5–5.2)
Alkaline Phosphatase: 64 U/L (ref 39–117)
BUN: 18 mg/dL (ref 6–23)
CO2: 26 mEq/L (ref 19–32)
Calcium: 9.1 mg/dL (ref 8.4–10.5)
Chloride: 104 mEq/L (ref 96–112)
Creatinine, Ser: 0.87 mg/dL (ref 0.40–1.20)
GFR: 79.79 mL/min (ref >60.00)
Glucose, Bld: 89 mg/dL (ref 70–99)
Potassium: 3.6 mEq/L (ref 3.5–5.1)
Sodium: 139 mEq/L (ref 135–145)
Total Bilirubin: 0.5 mg/dL (ref 0.2–1.2)
Total Protein: 6.7 g/dL (ref 6.0–8.3)

## 2022-08-15 LAB — CBC WITH DIFFERENTIAL/PLATELET
Basophils Absolute: 0 10*3/uL (ref 0.0–0.1)
Basophils Relative: 0.4 % (ref 0.0–3.0)
Eosinophils Absolute: 0.3 10*3/uL (ref 0.0–0.7)
Eosinophils Relative: 3.3 % (ref 0.0–5.0)
HCT: 44.9 % (ref 36.0–46.0)
Hemoglobin: 15.1 g/dL — ABNORMAL HIGH (ref 12.0–15.0)
Lymphocytes Relative: 27.9 % (ref 12.0–46.0)
Lymphs Abs: 2.2 10*3/uL (ref 0.7–4.0)
MCHC: 33.7 g/dL (ref 30.0–36.0)
MCV: 91.7 fl (ref 78.0–100.0)
Monocytes Absolute: 0.8 10*3/uL (ref 0.1–1.0)
Monocytes Relative: 9.7 % (ref 3.0–12.0)
Neutro Abs: 4.7 10*3/uL (ref 1.4–7.7)
Neutrophils Relative %: 58.7 % (ref 43.0–77.0)
Platelets: 214 10*3/uL (ref 150.0–400.0)
RBC: 4.9 Mil/uL (ref 3.87–5.11)
RDW: 14.5 % (ref 11.5–15.5)
WBC: 8 10*3/uL (ref 4.0–10.5)

## 2022-08-15 LAB — LIPID PANEL
Cholesterol: 154 mg/dL (ref 0–200)
HDL: 58.9 mg/dL (ref >39.00)
LDL Cholesterol: 79 mg/dL (ref 0–99)
NonHDL: 94.95
Total CHOL/HDL Ratio: 3
Triglycerides: 82 mg/dL (ref 0.0–149.0)
VLDL: 16.4 mg/dL (ref 0.0–40.0)

## 2022-08-15 LAB — T4, FREE: Free T4: 1.12 ng/dL (ref 0.60–1.60)

## 2022-08-15 LAB — TSH: TSH: 1.8 u[IU]/mL (ref 0.35–5.50)

## 2022-08-20 ENCOUNTER — Encounter: Payer: Commercial Managed Care - PPO | Admitting: Internal Medicine

## 2022-08-20 ENCOUNTER — Other Ambulatory Visit: Payer: Self-pay

## 2022-08-20 DIAGNOSIS — E0789 Other specified disorders of thyroid: Secondary | ICD-10-CM

## 2022-08-20 DIAGNOSIS — Z13220 Encounter for screening for lipoid disorders: Secondary | ICD-10-CM

## 2022-08-20 DIAGNOSIS — I10 Essential (primary) hypertension: Secondary | ICD-10-CM

## 2022-08-20 NOTE — Assessment & Plan Note (Deleted)
Colonoscopy 08/19/21 - internal hemorrhoids.  F/u colonoscopy in 10 years.  

## 2022-08-20 NOTE — Progress Notes (Deleted)
Subjective:    Patient ID: Jennifer Olsen, female    DOB: 10-28-1975, 47 y.o.   MRN: [id number]  Patient here for No chief complaint on file.   HPI Here for physical exam. Is s/p TAL/LSO and RSO.    Past Medical History:  Diagnosis Date   Hypertension    s/p renal artery doppler which was normal   Persistent headaches    Thyroid nodule    Past Surgical History:  Procedure Laterality Date   ABDOMINAL HYSTERECTOMY  09/18/2021   Procedure: HYSTERECTOMY ABDOMINAL;  Surgeon: Leida LauthBerchuck, Andrew, MD;  Location: [facility];  Service: Gynecology;;   AUGMENTATION MAMMAPLASTY Bilateral 05/2015   saline   BILATERAL SALPINGECTOMY Bilateral 09/18/2021   Procedure: OPEN BILATERAL SALPINGECTOMY;  Surgeon: Leida LauthBerchuck, Andrew, MD;  Location: [facility];  Service: Gynecology;  Laterality: Bilateral;   COLONOSCOPY WITH PROPOFOL N/A 08/19/2021   Procedure: COLONOSCOPY WITH PROPOFOL;  Surgeon: Midge MiniumWohl, Darren, MD;  [facility];  Service: Endoscopy;  Laterality: N/A;   CYSTOSCOPY  09/18/2021   Procedure: CYSTOSCOPY;  Surgeon: Leida LauthBerchuck, Andrew, MD;  Location: [facility];  Service: Gynecology;;   DILATION AND CURETTAGE OF UTERUS N/A 09/18/2021   Procedure: DILATATION AND CURETTAGE;  Surgeon: Leida LauthBerchuck, Andrew, MD;  Location: [facility];  Service: Gynecology;  Laterality: N/A;   FOOT SURGERY     bilateral, bunions, Dr. Orland Jarredroxler   HYSTEROSCOPY  09/18/2021   Procedure: HYSTEROSCOPY;  Surgeon: Leida LauthBerchuck, Andrew, MD;  Location: [facility];  Service: Gynecology;;   LAPAROSCOPIC LYSIS OF ADHESIONS  09/18/2021   Procedure: LAPAROSCOPIC LYSIS OF ADHESIONS;  Surgeon: Leida LauthBerchuck, Andrew, MD;  Location: [facility];  Service: Gynecology;;   LAPAROSCOPY  09/18/2021   Procedure: LAPAROSCOPY OPERATIVE;  Surgeon: Leida LauthBerchuck, Andrew, MD;  Location: [facility];  Service: Gynecology;;   [facility]. Logan Bores[facility]ans   SALPINGOOPHORECTOMY Left 09/18/2021   Procedure: OPEN SALPINGO OOPHORECTOMY;  Surgeon: Leida LauthBerchuck, Andrew, MD;   Location: [facility];  Service: Gynecology;  Laterality: Left;   WRIST SURGERY  2011   left wrist, fracture, Dr. Gerrit Heckaliff   Family History  Problem Relation Age of Onset   Aneurysm Mother    Heart disease Mother    Breast cancer Neg Hx    Social History   Socioeconomic History   Marital status: Unknown    Spouse name: Barbara CowerJason   Number of children: Not on file   Years of education: Not on file   Highest education level: Not on file  Occupational History   Not on file  Tobacco Use   Smoking status: Never   Smokeless tobacco: Never  Vaping Use   Vaping Use: Never used  Substance and Sexual Activity   Alcohol use: Yes    Comment: Occasional   Drug use: No   Sexual activity: Not on file  Other Topics Concern   Not on file  Social History Narrative   [place] in [place] 3 children .  Dog in home.regular Exercise - walking 20-3140min daily   Social Determinants of Health   Financial Resource Strain: Not on file  Food Insecurity: Not on file  Transportation Needs: Not on file  Physical Activity: Not on file  Stress: Not on file  Social Connections: Not on file     Review of Systems     Objective:     There were no vitals taken for this visit. Wt Readings from Last 3 Encounters:  02/11/22 164 lb 12.8 oz (74.8 kg)  12/05/21 161 lb (73  kg)  11/04/21 157 lb (71.2 kg)    Physical Exam   Outpatient Encounter Medications as of 08/20/2022  Medication Sig   atenolol (TENORMIN) 50 MG tablet TAKE 1 TABLET (50 MG TOTAL) BY MOUTH DAILY.   azithromycin (ZITHROMAX) 250 MG tablet Take 2 tablets today then 1 tablet once daily days 2 to 5   cephALEXin (KEFLEX) 500 MG capsule Take 2 capsules by mouth every 12 hours; Start after surgery and FINISH entire prescription   ciprofloxacin (CIPRO) 500 MG tablet Take 1 tablet (500 mg total) by mouth 2 (two) times daily for 5 days.   docusate sodium (COLACE) 100 MG capsule Take 1 capsule (100 mg total) by mouth 2 (two) times daily.    fexofenadine (ALLEGRA) 180 MG tablet Take 180 mg by mouth daily as needed (allergies).   fluconazole (DIFLUCAN) 150 MG tablet Take 1 tablet (150 mg total) by mouth as needed.   gabapentin (NEURONTIN) 300 MG capsule Take 1 capsule (300 mg total) by mouth 3 (three) times daily as needed for up to 14 days (breakthrough pain).   HYDROcodone-acetaminophen (NORCO/VICODIN) 5-325 MG tablet Take 1 tablet by mouth every 4-6 hours as needed for pain; Take as directed for post-surgical pain   hydrocortisone 2.5 % cream Apply to affected area as directed   meloxicam (MOBIC) 7.5 MG tablet Take 1 tablet (7.5 mg total) by mouth 2 (two) times daily.   Multiple Vitamin (MULTIVITAMIN) tablet Take 1 tablet by mouth daily.   promethazine (PHENERGAN) 25 MG tablet Take 0.5-1 tablets by mouth every 6-8 hours as needed for nausea; May cause drowsiness   Psyllium (METAMUCIL PO) Take 2 Scoops by mouth at bedtime.   simethicone (MYLICON) 80 MG chewable tablet Chew 1 tablet (80 mg total) by mouth 4 (four) times daily as needed for flatulence.   triamterene-hydrochlorothiazide (MAXZIDE-25) 37.5-25 MG tablet TAKE 1 TABLET BY MOUTH DAILY.   No facility-administered encounter medications on file as of 08/20/2022.     Lab Results  Component Value Date   WBC 8.0 08/15/2022   HGB 15.1 (H) 08/15/2022   HCT 44.9 08/15/2022   PLT 214.0 08/15/2022   GLUCOSE 89 08/15/2022   CHOL 154 08/15/2022   TRIG 82.0 08/15/2022   HDL 58.90 08/15/2022   LDLCALC 79 08/15/2022   ALT 14 08/15/2022   AST 21 08/15/2022   NA 139 08/15/2022   K 3.6 08/15/2022   CL 104 08/15/2022   CREATININE 0.87 08/15/2022   BUN 18 08/15/2022   CO2 26 08/15/2022   TSH 1.80 08/15/2022   MICROALBUR 1.7 01/18/2016    MR BREAST BILATERAL WO CONTRAST  Result Date: 10/17/2021 CLINICAL DATA:  Extracapsular silicone demonstrated on the right on a bilateral screening mammogram dated 08/02/2021. The patient has bilateral retropectoral silicone breast implants  placed in 2017. Asymptomatic. EXAM: BILATERAL BREAST MRI  WITHOUT CONTRAST TECHNIQUE: Multiplanar, multisequence MR images of both breasts without contrast. The exam was used to evaluate implant integrity. Because of the sequences used and the lack of intravenous contrast, this study is not diagnostic for breast lesions. Three-dimensional MR images were rendered by post-processing of the original MR data on an independent workstation. The three-dimensional MR images were interpreted, and findings are reported in the following complete MRI report for this study. Three dimensional images were evaluated at the independent DynaCad workstation COMPARISON:  Previous mammograms, the most recent dated 08/02/2021. FINDINGS: Breast composition: c. Heterogeneous fibroglandular tissue. Right breast: Retropectoral implant rupture with associated multiple areas of silicone surrounding the  ruptured implant. Left breast: Intact retropectoral silicone implant. Ancillary findings: None. IMPRESSION: 1. Ruptured right retropectoral silicone implant with multiple areas of silicone surrounding the ruptured implant. 2. Intact left retropectoral silicone implant. RECOMMENDATION: 1. Clinical follow-up. 2. Bilateral screening mammogram in March 2024 when due. BI-RADS CATEGORY  2: Benign. Electronically Signed   By: Beckie Salts M.D.   On: 10/17/2021 12:53      Assessment & Plan:  Thyroid fullness  Screening cholesterol level  Primary hypertension     [place], MD

## 2022-08-20 NOTE — Assessment & Plan Note (Deleted)
Physical today 08/20/22.  PAP 08/09/19 - negative with negative HPV.  Colonoscopy 08/19/21 - non bleeding internal hemorrhoids.  Recommended f/u in 10 years.   Mammogram 07/13/21 - Birads II.

## 2022-08-25 ENCOUNTER — Encounter: Payer: Self-pay | Admitting: Internal Medicine

## 2022-08-25 ENCOUNTER — Other Ambulatory Visit: Payer: Self-pay

## 2022-08-25 ENCOUNTER — Ambulatory Visit (INDEPENDENT_AMBULATORY_CARE_PROVIDER_SITE_OTHER): Payer: Commercial Managed Care - PPO | Admitting: Internal Medicine

## 2022-08-25 VITALS — BP 128/70 | HR 61 | Temp 98.0°F | Resp 16 | Ht 67.0 in | Wt 164.0 lb

## 2022-08-25 DIAGNOSIS — Z13220 Encounter for screening for lipoid disorders: Secondary | ICD-10-CM

## 2022-08-25 DIAGNOSIS — E0789 Other specified disorders of thyroid: Secondary | ICD-10-CM

## 2022-08-25 DIAGNOSIS — I10 Essential (primary) hypertension: Secondary | ICD-10-CM

## 2022-08-25 DIAGNOSIS — Z0000 Encounter for general adult medical examination without abnormal findings: Secondary | ICD-10-CM | POA: Diagnosis not present

## 2022-08-25 DIAGNOSIS — Z1211 Encounter for screening for malignant neoplasm of colon: Secondary | ICD-10-CM | POA: Diagnosis not present

## 2022-08-25 DIAGNOSIS — Z9882 Breast implant status: Secondary | ICD-10-CM

## 2022-08-25 DIAGNOSIS — Z90710 Acquired absence of both cervix and uterus: Secondary | ICD-10-CM

## 2022-08-25 MED ORDER — ATENOLOL 50 MG PO TABS
50.0000 mg | ORAL_TABLET | Freq: Every day | ORAL | 3 refills | Status: DC
  Filled 2022-08-25: qty 90, 90d supply, fill #0
  Filled 2022-11-19: qty 90, 90d supply, fill #1
  Filled 2023-03-05: qty 90, 90d supply, fill #2

## 2022-08-25 MED ORDER — TRIAMTERENE-HCTZ 37.5-25 MG PO TABS
1.0000 | ORAL_TABLET | Freq: Every day | ORAL | 3 refills | Status: DC
  Filled 2022-08-25: qty 90, 90d supply, fill #0
  Filled 2022-11-19: qty 90, 90d supply, fill #1
  Filled 2023-03-05: qty 90, 90d supply, fill #2

## 2022-08-25 MED ORDER — MAGNESIUM OXIDE -MG SUPPLEMENT 400 (240 MG) MG PO TABS: 400.0000 mg | ORAL_TABLET | Freq: Every day | ORAL | 0 refills | Status: AC

## 2022-08-25 NOTE — Assessment & Plan Note (Addendum)
Physical today 08/25/22.  PAP 08/09/19 - negative with negative HPV.  Now S/p hysterectomy.  Colonoscopy 08/19/21 - non bleeding internal hemorrhoids.  Recommended f/u in 10 years.   Mammogram 07/13/21 - Birads II.  Mammogram 10/2021 -  Ruptured right retropectoral silicone implant with multiple area of silicone surrounding the ruptured implant. ntact left retropectoral silicone implant. Clinical follow-up. Bilateral screening mammogram in March 2024 when due. Notify me when ready to schedule.  Wanted to hold.

## 2022-08-25 NOTE — Progress Notes (Signed)
Subjective:    Patient ID: Jennifer Olsen, female    DOB: February 18, 1976, 47 y.o.   MRN: [id number]  Patient here for  Chief Complaint  Patient presents with   Annual Exam    HPI Here for a physical exam. Is s/p hemorrhoidal banding. Also recent TAH/LSO and right salpingectomy.  Is doing well s/p surgery.  Feeling good.  Staying active.  No chest pain or sob reported.  No cough or congestion reported.  No abdominal pain.  Bowels moving.  Takes metamucil regularly.    Past Medical History:  Diagnosis Date   Hypertension    s/p renal artery doppler which was normal   Persistent headaches    Thyroid nodule    Past Surgical History:  Procedure Laterality Date   ABDOMINAL HYSTERECTOMY  09/18/2021   Procedure: HYSTERECTOMY ABDOMINAL;  Surgeon: Leida Lauth, MD;  Location: [facility];  Service: Gynecology;;   AUGMENTATION MAMMAPLASTY Bilateral 05/2015   saline   BILATERAL SALPINGECTOMY Bilateral 09/18/2021   Procedure: OPEN BILATERAL SALPINGECTOMY;  Surgeon: Leida Lauth, MD;  Location: [facility];  Service: Gynecology;  Laterality: Bilateral;   COLONOSCOPY WITH PROPOFOL N/A 08/19/2021   Procedure: COLONOSCOPY WITH PROPOFOL;  Surgeon: Midge Minium, MD;  Location: [facility];  Service: Endoscopy;  Laterality: N/A;   CYSTOSCOPY  09/18/2021   Procedure: CYSTOSCOPY;  Surgeon: Leida Lauth, MD;  Location: [facility];  Service: Gynecology;;   DILATION AND CURETTAGE OF UTERUS N/A 09/18/2021   Procedure: DILATATION AND CURETTAGE;  Surgeon: Leida Lauth, MD;  Location: [facility];  Service: Gynecology;  Laterality: N/A;   FOOT SURGERY     bilateral, bunions, Dr. Orland Jarred   HYSTEROSCOPY  09/18/2021   Procedure: HYSTEROSCOPY;  Surgeon: Leida Lauth, MD;  Location: [facility];  Service: Gynecology;;   LAPAROSCOPIC LYSIS OF ADHESIONS  09/18/2021   Procedure: LAPAROSCOPIC LYSIS OF ADHESIONS;  Surgeon: Leida Lauth, MD;  Location: [facility];  Service: Gynecology;;   LAPAROSCOPY   09/18/2021   Procedure: LAPAROSCOPY OPERATIVE;  Surgeon: Leida Lauth, MD;  Location: [facility];  Service: Gynecology;;   [facility] ABLATION  2010   Dr. Logan Bores   SALPINGOOPHORECTOMY Left 09/18/2021   Procedure: OPEN SALPINGO OOPHORECTOMY;  Surgeon: Leida Lauth, MD;  Location: [facility];  Service: Gynecology;  Laterality: Left;   WRIST SURGERY  2011   left wrist, fracture, Dr. Gerrit Heck   Family History  Problem Relation Age of Onset   Aneurysm Mother    Heart disease Mother    Breast cancer Neg Hx    Social History   Socioeconomic History   Marital status: Unknown    Spouse name: Barbara Cower   Number of children: Not on file   Years of education: Not on file   Highest education level: Not on file  Occupational History   Not on file  Tobacco Use   Smoking status: Never   Smokeless tobacco: Never  Vaping Use   Vaping Use: Never used  Substance and Sexual Activity   Alcohol use: Yes    Comment: Occasional   Drug use: No   Sexual activity: Not on file  Other Topics Concern   Not on file  Social History Narrative   Lives in [place] with 3 children .  Dog in home.regular Exercise - walking 20-30min daily   Social Determinants of Health   Financial Resource Strain: Not on file  Food Insecurity: Not on file  Transportation Needs: Not on file  Physical Activity: Not on file  Stress: Not on file  Social Connections: Not on file     Review of Systems  Constitutional:  Negative for appetite change and unexpected weight change.  HENT:  Negative for congestion, sinus pressure and sore throat.   Eyes:  Negative for pain and visual disturbance.  Respiratory:  Negative for cough, chest tightness and shortness of breath.   Cardiovascular:  Negative for chest pain and palpitations.  Gastrointestinal:  Negative for abdominal pain, diarrhea, nausea and vomiting.  Genitourinary:  Negative for difficulty urinating and dysuria.  Musculoskeletal:  Negative for joint swelling and  myalgias.  Skin:  Negative for color change and rash.  Neurological:  Negative for dizziness and headaches.  Hematological:  Negative for adenopathy. Does not bruise/bleed easily.  Psychiatric/Behavioral:  Negative for agitation and dysphoric mood.        Objective:     BP 128/70   Pulse 61   Temp 98 F (36.7 C)   Resp 16   Ht  (1.702 m)   Wt 164 lb (74.4 kg)   SpO2 98%   BMI 25.69 kg/m  Wt Readings from Last 3 Encounters:  08/25/22 164 lb (74.4 kg)  02/11/22 164 lb 12.8 oz (74.8 kg)  12/05/21 161 lb (73 kg)    Physical Exam Vitals reviewed.  Constitutional:      General: She is not in acute distress.    Appearance: Normal appearance.  HENT:     Head: Normocephalic and atraumatic.     Right Ear: External ear normal.     Left Ear: External ear normal.  Eyes:     General: No scleral icterus.       Right eye: No discharge.        Left eye: No discharge.     Conjunctiva/sclera: Conjunctivae normal.  Neck:     Thyroid: No thyromegaly.  Cardiovascular:     Rate and Rhythm: Normal rate and regular rhythm.  Pulmonary:     Effort: No respiratory distress.     Breath sounds: Normal breath sounds. No wheezing.  Abdominal:     General: Bowel sounds are normal.     Palpations: Abdomen is soft.     Tenderness: There is no abdominal tenderness.  Musculoskeletal:        General: No swelling or tenderness.     Cervical back: Neck supple. No tenderness.  Lymphadenopathy:     Cervical: No cervical adenopathy.  Skin:    Findings: No erythema or rash.  Neurological:     Mental Status: She is alert.  Psychiatric:        Mood and Affect: Mood normal.        Behavior: Behavior normal.      Outpatient Encounter Medications as of 08/25/2022  Medication Sig   magnesium oxide (MAG-OX) 400 (240 Mg) MG tablet Take 1 tablet (400 mg total) by mouth daily.   atenolol (TENORMIN) 50 MG tablet TAKE 1 TABLET (50 MG TOTAL) BY MOUTH DAILY.   fexofenadine (ALLEGRA) 180 MG tablet  Take 180 mg by mouth daily as needed (allergies).   hydrocortisone 2.5 % cream Apply to affected area as directed   Multiple Vitamin (MULTIVITAMIN) tablet Take 1 tablet by mouth daily.   Psyllium (METAMUCIL PO) Take 2 Scoops by mouth at bedtime.   triamterene-hydrochlorothiazide (MAXZIDE-25) 37.5-25 MG tablet TAKE 1 TABLET BY MOUTH DAILY.   [DISCONTINUED] atenolol (TENORMIN) 50 MG tablet TAKE 1 TABLET (50 MG TOTAL) BY MOUTH DAILY.   [DISCONTINUED] azithromycin (ZITHROMAX) 250 MG tablet Take 2 tablets today  then 1 tablet once daily days 2 to 5   [DISCONTINUED] cephALEXin (KEFLEX) 500 MG capsule Take 2 capsules by mouth every 12 hours; Start after surgery and FINISH entire prescription   [DISCONTINUED] ciprofloxacin (CIPRO) 500 MG tablet Take 1 tablet (500 mg total) by mouth 2 (two) times daily for 5 days.   [DISCONTINUED] docusate sodium (COLACE) 100 MG capsule Take 1 capsule (100 mg total) by mouth 2 (two) times daily.   [DISCONTINUED] fluconazole (DIFLUCAN) 150 MG tablet Take 1 tablet (150 mg total) by mouth as needed.   [DISCONTINUED] gabapentin (NEURONTIN) 300 MG capsule Take 1 capsule (300 mg total) by mouth 3 (three) times daily as needed for up to 14 days (breakthrough pain).   [DISCONTINUED] HYDROcodone-acetaminophen (NORCO/VICODIN) 5-325 MG tablet Take 1 tablet by mouth every 4-6 hours as needed for pain; Take as directed for post-surgical pain   [DISCONTINUED] meloxicam (MOBIC) 7.5 MG tablet Take 1 tablet (7.5 mg total) by mouth 2 (two) times daily.   [DISCONTINUED] promethazine (PHENERGAN) 25 MG tablet Take 0.5-1 tablets by mouth every 6-8 hours as needed for nausea; May cause drowsiness   [DISCONTINUED] simethicone (MYLICON) 80 MG chewable tablet Chew 1 tablet (80 mg total) by mouth 4 (four) times daily as needed for flatulence.   [DISCONTINUED] triamterene-hydrochlorothiazide (MAXZIDE-25) 37.5-25 MG tablet TAKE 1 TABLET BY MOUTH DAILY.   No facility-administered encounter medications on  file as of 08/25/2022.     Lab Results  Component Value Date   WBC 8.0 08/15/2022   HGB 15.1 (H) 08/15/2022   HCT 44.9 08/15/2022   PLT 214.0 08/15/2022   GLUCOSE 89 08/15/2022   CHOL 154 08/15/2022   TRIG 82.0 08/15/2022   HDL 58.90 08/15/2022   LDLCALC 79 08/15/2022   ALT 14 08/15/2022   AST 21 08/15/2022   NA 139 08/15/2022   K 3.6 08/15/2022   CL 104 08/15/2022   CREATININE 0.87 08/15/2022   BUN 18 08/15/2022   CO2 26 08/15/2022   TSH 1.80 08/15/2022   MICROALBUR 1.7 01/18/2016    MR BREAST BILATERAL WO CONTRAST  Result Date: 10/17/2021 CLINICAL DATA:  Extracapsular silicone demonstrated on the right on a bilateral screening mammogram dated 08/02/2021. The patient has bilateral retropectoral silicone breast implants placed in 2017. Asymptomatic. EXAM: BILATERAL BREAST MRI  WITHOUT CONTRAST TECHNIQUE: Multiplanar, multisequence MR images of both breasts without contrast. The exam was used to evaluate implant integrity. Because of the sequences used and the lack of intravenous contrast, this study is not diagnostic for breast lesions. Three-dimensional MR images were rendered by post-processing of the original MR data on an independent workstation. The three-dimensional MR images were interpreted, and findings are reported in the following complete MRI report for this study. Three dimensional images were evaluated at the independent DynaCad workstation COMPARISON:  Previous mammograms, the most recent dated 08/02/2021. FINDINGS: Breast composition: c. Heterogeneous fibroglandular tissue. Right breast: Retropectoral implant rupture with associated multiple areas of silicone surrounding the ruptured implant. Left breast: Intact retropectoral silicone implant. Ancillary findings: None. IMPRESSION: 1. Ruptured right retropectoral silicone implant with multiple areas of silicone surrounding the ruptured implant. 2. Intact left retropectoral silicone implant. RECOMMENDATION: 1. Clinical  follow-up. 2. Bilateral screening mammogram in March 2024 when due. BI-RADS CATEGORY  2: Benign. Electronically Signed   By: Beckie Salts M.D.   On: 10/17/2021 12:53      Assessment & Plan:  Routine general medical examination at a health care facility Assessment & Plan: Physical today 08/25/22.  PAP 08/09/19 -  negative with negative HPV.  Now S/p hysterectomy.  Colonoscopy 08/19/21 - non bleeding internal hemorrhoids.  Recommended f/u in 10 years.   Mammogram 07/13/21 - Birads II.  Mammogram 10/2021 -  Ruptured right retropectoral silicone implant with multiple area of silicone surrounding the ruptured implant. ntact left retropectoral silicone implant. Clinical follow-up. Bilateral screening mammogram in March 2024 when due. Notify me when ready to schedule.  Wanted to hold.    Primary hypertension Assessment & Plan: Blood pressure doing well on tenormin and triam/hctz.  Follow pressures.  Follow metabolic panel.   Orders: -     Comprehensive metabolic panel; Future  Screening cholesterol level  Thyroid fullness Assessment & Plan: Saw Dr Tedd Sias 03/06/21 - had f/u ultrasound - diffuse enlargement of thyroid  Two thyroid nodules, measuring up to 0.9 cm in right lobe and 1.0 cm in left lobe. Both have a fairly unremarkable appearance and are stable in size in comparison to a prior ultrasound on 02/2020.  No role for routine follow up neck imaging.     Orders: -     T4, free; Future -     TSH; Future  Breast implant status Assessment & Plan: Has seen Dr Noel Gerold - implant rupture.  Need records.    Colon cancer screening Assessment & Plan: Colonoscopy 08/19/21 - internal hemorrhoids.  F/u colonoscopy in 10 years.    Status post abdominal hysterectomy Assessment & Plan: Doing well s/p surgery.  Follow.    Other orders -     Atenolol; TAKE 1 TABLET (50 MG TOTAL) BY MOUTH DAILY.  Dispense: 90 tablet; Refill: 3 -     Triamterene-HCTZ; TAKE 1 TABLET BY MOUTH DAILY.  Dispense: 90 tablet;  Refill: 3 -     Magnesium Oxide -Mg Supplement; Take 1 tablet (400 mg total) by mouth daily.  Dispense: 30 tablet; Refill: 0     Dale Lima, MD

## 2022-08-31 ENCOUNTER — Encounter: Payer: Self-pay | Admitting: Internal Medicine

## 2022-08-31 NOTE — Assessment & Plan Note (Signed)
Doing well s/p surgery.  Follow.  

## 2022-08-31 NOTE — Assessment & Plan Note (Signed)
Blood pressure doing well on tenormin and triam/hctz.  Follow pressures.  Follow metabolic panel.  

## 2022-08-31 NOTE — Assessment & Plan Note (Signed)
Colonoscopy 08/19/21 - internal hemorrhoids.  F/u colonoscopy in 10 years.  

## 2022-08-31 NOTE — Assessment & Plan Note (Signed)
Saw Dr Solum 03/06/21 - had f/u ultrasound - diffuse enlargement of thyroid  Two thyroid nodules, measuring up to 0.9 cm in right lobe and 1.0 cm in left lobe. Both have a fairly unremarkable appearance and are stable in size in comparison to a prior ultrasound on 02/2020.  No role for routine follow up neck imaging.    

## 2022-08-31 NOTE — Assessment & Plan Note (Signed)
Has seen Dr Noel Gerold - implant rupture.  Need records.

## 2022-11-19 ENCOUNTER — Other Ambulatory Visit: Payer: Self-pay

## 2023-01-21 ENCOUNTER — Other Ambulatory Visit: Payer: Self-pay

## 2023-02-24 ENCOUNTER — Ambulatory Visit: Payer: Commercial Managed Care - PPO | Admitting: Internal Medicine

## 2023-03-13 ENCOUNTER — Other Ambulatory Visit: Payer: Self-pay

## 2023-03-13 MED ORDER — AZITHROMYCIN 250 MG PO TABS
ORAL_TABLET | ORAL | 0 refills | Status: AC
  Filled 2023-03-13: qty 6, 5d supply, fill #0

## 2023-03-31 ENCOUNTER — Other Ambulatory Visit: Payer: Self-pay

## 2023-03-31 ENCOUNTER — Encounter: Payer: Self-pay | Admitting: Internal Medicine

## 2023-03-31 ENCOUNTER — Ambulatory Visit: Payer: Commercial Managed Care - PPO | Admitting: Internal Medicine

## 2023-03-31 VITALS — BP 116/70 | HR 70 | Temp 98.0°F | Resp 16 | Ht 67.0 in | Wt 165.6 lb

## 2023-03-31 DIAGNOSIS — Z90710 Acquired absence of both cervix and uterus: Secondary | ICD-10-CM | POA: Diagnosis not present

## 2023-03-31 DIAGNOSIS — E0789 Other specified disorders of thyroid: Secondary | ICD-10-CM

## 2023-03-31 DIAGNOSIS — Z1231 Encounter for screening mammogram for malignant neoplasm of breast: Secondary | ICD-10-CM

## 2023-03-31 DIAGNOSIS — M25522 Pain in left elbow: Secondary | ICD-10-CM | POA: Diagnosis not present

## 2023-03-31 DIAGNOSIS — I10 Essential (primary) hypertension: Secondary | ICD-10-CM | POA: Diagnosis not present

## 2023-03-31 DIAGNOSIS — Z1211 Encounter for screening for malignant neoplasm of colon: Secondary | ICD-10-CM

## 2023-03-31 LAB — COMPREHENSIVE METABOLIC PANEL
ALT: 15 U/L (ref 0–35)
AST: 18 U/L (ref 0–37)
Albumin: 4.7 g/dL (ref 3.5–5.2)
Alkaline Phosphatase: 53 U/L (ref 39–117)
BUN: 18 mg/dL (ref 6–23)
CO2: 27 meq/L (ref 19–32)
Calcium: 9.5 mg/dL (ref 8.4–10.5)
Chloride: 104 meq/L (ref 96–112)
Creatinine, Ser: 0.83 mg/dL (ref 0.40–1.20)
GFR: 84.05 mL/min (ref >60.00)
Glucose, Bld: 94 mg/dL (ref 70–99)
Potassium: 3.7 meq/L (ref 3.5–5.1)
Sodium: 140 meq/L (ref 135–145)
Total Bilirubin: 0.7 mg/dL (ref 0.2–1.2)
Total Protein: 7.3 g/dL (ref 6.0–8.3)

## 2023-03-31 LAB — T4, FREE: Free T4: 1 ng/dL (ref 0.60–1.60)

## 2023-03-31 LAB — TSH: TSH: 0.87 u[IU]/mL (ref 0.35–5.50)

## 2023-03-31 MED ORDER — TRIAMTERENE-HCTZ 37.5-25 MG PO TABS
1.0000 | ORAL_TABLET | Freq: Every day | ORAL | 3 refills | Status: AC
  Filled 2023-03-31 – 2023-06-05 (×2): qty 90, 90d supply, fill #0
  Filled 2023-08-31: qty 90, 90d supply, fill #1
  Filled 2023-11-26: qty 90, 90d supply, fill #2

## 2023-03-31 MED ORDER — ATENOLOL 50 MG PO TABS
50.0000 mg | ORAL_TABLET | Freq: Every day | ORAL | 3 refills | Status: AC
  Filled 2023-03-31 – 2023-06-05 (×2): qty 90, 90d supply, fill #0
  Filled 2023-08-31: qty 90, 90d supply, fill #1
  Filled 2023-11-26: qty 90, 90d supply, fill #2

## 2023-03-31 NOTE — Progress Notes (Signed)
Subjective:    Patient ID: Jennifer Olsen, female    DOB: 11/24/1975, 47 y.o.   MRN: [id number]  Patient here for  Chief Complaint  Patient presents with   Medical Management of Chronic Issues    HPI Here for f/u  - hypertension. She reports she is doing relatively well.  Handling stress.  Exercising.  No chest pain or sob with increased activity or exertion.  No abdominal pain or bowel change.  Had f/u with Dr Noel Gerold - surgery - last week.  Stable. Has noticed left elbow pain.  Discussed tendinitis - elbow strap.     Past Medical History:  Diagnosis Date   Hypertension    s/p renal artery doppler which was normal   Persistent headaches    Thyroid nodule    Past Surgical History:  Procedure Laterality Date   ABDOMINAL HYSTERECTOMY  09/18/2021   Procedure: HYSTERECTOMY ABDOMINAL;  Surgeon: Leida Lauth, MD;  Location: [facility];  Service: Gynecology;;   AUGMENTATION MAMMAPLASTY Bilateral 05/2015   saline   BILATERAL SALPINGECTOMY Bilateral 09/18/2021   Procedure: OPEN BILATERAL SALPINGECTOMY;  Surgeon: Leida Lauth, MD;  Location: [facility];  Service: Gynecology;  Laterality: Bilateral;   COLONOSCOPY WITH PROPOFOL N/A 08/19/2021   Procedure: COLONOSCOPY WITH PROPOFOL;  Surgeon: Midge Minium, MD;  Location: [facility];  Service: Endoscopy;  Laterality: N/A;   CYSTOSCOPY  09/18/2021   Procedure: CYSTOSCOPY;  Surgeon: Leida Lauth, MD;  Location: [facility];  Service: Gynecology;;   DILATION AND CURETTAGE OF UTERUS N/A 09/18/2021   Procedure: DILATATION AND CURETTAGE;  Surgeon: Leida Lauth, MD;  Location: [facility];  Service: Gynecology;  Laterality: N/A;   FOOT SURGERY     bilateral, bunions, Dr. Orland Jarred   HYSTEROSCOPY  09/18/2021   Procedure: HYSTEROSCOPY;  Surgeon: Leida Lauth, MD;  Location: [facility];  Service: Gynecology;;   LAPAROSCOPIC LYSIS OF ADHESIONS  09/18/2021   Procedure: LAPAROSCOPIC LYSIS OF ADHESIONS;  Surgeon: Leida Lauth, MD;  Location:  [facility];  Service: Gynecology;;   LAPAROSCOPY  09/18/2021   Procedure: LAPAROSCOPY OPERATIVE;  Surgeon: Leida Lauth, MD;  Location: [facility];  Service: Gynecology;;   [facility] ABLATION  2010   Dr. Logan Bores   SALPINGOOPHORECTOMY Left 09/18/2021   Procedure: OPEN SALPINGO OOPHORECTOMY;  Surgeon: Leida Lauth, MD;  Location: [facility];  Service: Gynecology;  Laterality: Left;   WRIST SURGERY  2011   left wrist, fracture, Dr. Gerrit Heck   Family History  Problem Relation Age of Onset   Aneurysm Mother    Heart disease Mother    Breast cancer Neg Hx    Social History   Socioeconomic History   Marital status: Unknown    Spouse name: Barbara Cower   Number of children: Not on file   Years of education: Not on file   Highest education level: Not on file  Occupational History   Not on file  Tobacco Use   Smoking status: Never   Smokeless tobacco: Never  Vaping Use   Vaping status: Never Used  Substance and Sexual Activity   Alcohol use: Yes    Comment: Occasional   Drug use: No   Sexual activity: Not on file  Other Topics Concern   Not on file  Social History Narrative   Lives in [place] with 3 children .  Dog in home.regular Exercise - walking 20-73min daily   Social Determinants of Health   Financial Resource Strain: Not on file  Food Insecurity: Not on file  Transportation Needs: Not  on file  Physical Activity: Not on file  Stress: Not on file  Social Connections: Not on file     Review of Systems  Constitutional:  Negative for appetite change and unexpected weight change.  HENT:  Negative for congestion and sinus pressure.   Respiratory:  Negative for cough, chest tightness and shortness of breath.   Cardiovascular:  Negative for chest pain and palpitations.  Gastrointestinal:  Negative for abdominal pain, diarrhea, nausea and vomiting.  Genitourinary:  Negative for difficulty urinating and dysuria.  Musculoskeletal:  Negative for joint swelling and myalgias.        Increased pain - left elbow.   Skin:  Negative for color change and rash.  Neurological:  Negative for dizziness and headaches.  Psychiatric/Behavioral:  Negative for agitation and dysphoric mood.        Objective:     BP 116/70   Pulse 70   Temp 98 F (36.7 C)   Resp 16   Ht 5\' 7"  (1.702 m)   Wt 165 lb 9.6 oz (75.1 kg)   SpO2 98%   BMI 25.94 kg/m  Wt Readings from Last 3 Encounters:  03/31/23 165 lb 9.6 oz (75.1 kg)  08/25/22 164 lb (74.4 kg)  02/11/22 164 lb 12.8 oz (74.8 kg)    Physical Exam Vitals reviewed.  Constitutional:      General: She is not in acute distress.    Appearance: Normal appearance.  HENT:     Head: Normocephalic and atraumatic.     Right Ear: External ear normal.     Left Ear: External ear normal.  Eyes:     General: No scleral icterus.       Right eye: No discharge.        Left eye: No discharge.     Conjunctiva/sclera: Conjunctivae normal.  Neck:     Thyroid: No thyromegaly.  Cardiovascular:     Rate and Rhythm: Normal rate and regular rhythm.  Pulmonary:     Effort: No respiratory distress.     Breath sounds: Normal breath sounds. No wheezing.  Abdominal:     General: Bowel sounds are normal.     Palpations: Abdomen is soft.     Tenderness: There is no abdominal tenderness.  Musculoskeletal:        General: No swelling.     Cervical back: Neck supple. No tenderness.     Comments: Increased pain to palpation - left elbow - epicondyle.  Increased pain with rotation of forearm.   Lymphadenopathy:     Cervical: No cervical adenopathy.  Skin:    Findings: No erythema or rash.  Neurological:     Mental Status: She is alert.  Psychiatric:        Mood and Affect: Mood normal.        Behavior: Behavior normal.      Outpatient Encounter Medications as of 03/31/2023  Medication Sig   atenolol (TENORMIN) 50 MG tablet Take 1 tablet (50 mg total) by mouth daily.   fexofenadine (ALLEGRA) 180 MG tablet Take 180 mg by mouth daily as  needed (allergies).   hydrocortisone 2.5 % cream Apply to affected area as directed   magnesium oxide (MAG-OX) 400 (240 Mg) MG tablet Take 1 tablet (400 mg total) by mouth daily.   Multiple Vitamin (MULTIVITAMIN) tablet Take 1 tablet by mouth daily.   Psyllium (METAMUCIL PO) Take 2 Scoops by mouth at bedtime.   triamterene-hydrochlorothiazide (MAXZIDE-25) 37.5-25 MG tablet Take 1 tablet by mouth daily.   [  DISCONTINUED] atenolol (TENORMIN) 50 MG tablet TAKE 1 TABLET (50 MG TOTAL) BY MOUTH DAILY.   [DISCONTINUED] triamterene-hydrochlorothiazide (MAXZIDE-25) 37.5-25 MG tablet TAKE 1 TABLET BY MOUTH DAILY.   No facility-administered encounter medications on file as of 03/31/2023.     Lab Results  Component Value Date   WBC 8.0 08/15/2022   HGB 15.1 (H) 08/15/2022   HCT 44.9 08/15/2022   PLT 214.0 08/15/2022   GLUCOSE 94 03/31/2023   CHOL 154 08/15/2022   TRIG 82.0 08/15/2022   HDL 58.90 08/15/2022   LDLCALC 79 08/15/2022   ALT 15 03/31/2023   AST 18 03/31/2023   NA 140 03/31/2023   K 3.7 03/31/2023   CL 104 03/31/2023   CREATININE 0.83 03/31/2023   BUN 18 03/31/2023   CO2 27 03/31/2023   TSH 0.87 03/31/2023   MICROALBUR 1.7 01/18/2016    MR BREAST BILATERAL WO CONTRAST  Result Date: 10/17/2021 CLINICAL DATA:  Extracapsular silicone demonstrated on the right on a bilateral screening mammogram dated 08/02/2021. The patient has bilateral retropectoral silicone breast implants placed in 2017. Asymptomatic. EXAM: BILATERAL BREAST MRI  WITHOUT CONTRAST TECHNIQUE: Multiplanar, multisequence MR images of both breasts without contrast. The exam was used to evaluate implant integrity. Because of the sequences used and the lack of intravenous contrast, this study is not diagnostic for breast lesions. Three-dimensional MR images were rendered by post-processing of the original MR data on an independent workstation. The three-dimensional MR images were interpreted, and findings are reported in the  following complete MRI report for this study. Three dimensional images were evaluated at the independent DynaCad workstation COMPARISON:  Previous mammograms, the most recent dated 08/02/2021. FINDINGS: Breast composition: c. Heterogeneous fibroglandular tissue. Right breast: Retropectoral implant rupture with associated multiple areas of silicone surrounding the ruptured implant. Left breast: Intact retropectoral silicone implant. Ancillary findings: None. IMPRESSION: 1. Ruptured right retropectoral silicone implant with multiple areas of silicone surrounding the ruptured implant. 2. Intact left retropectoral silicone implant. RECOMMENDATION: 1. Clinical follow-up. 2. Bilateral screening mammogram in March 2024 when due. BI-RADS CATEGORY  2: Benign. Electronically Signed   By: Beckie Salts M.D.   On: 10/17/2021 12:53      Assessment & Plan:  Status post abdominal hysterectomy Assessment & Plan: Doing well s/p surgery.  Follow.    Thyroid fullness Assessment & Plan: Saw Dr Tedd Sias 03/06/21 - had f/u ultrasound - diffuse enlargement of thyroid  Two thyroid nodules, measuring up to 0.9 cm in right lobe and 1.0 cm in left lobe. Both have a fairly unremarkable appearance and are stable in size in comparison to a prior ultrasound on 02/2020.  No role for routine follow up neck imaging.     Orders: -     TSH -     T4, free  Primary hypertension Assessment & Plan: Blood pressure doing well on tenormin and triam/hctz.  Follow pressures.  Follow metabolic panel.   Orders: -     Comprehensive metabolic panel  Colon cancer screening Assessment & Plan: Colonoscopy 08/19/21 - internal hemorrhoids.  F/u colonoscopy in 10 years.    Elbow pain, left Assessment & Plan: Appears to be c/w tendinitis.  Elbow strap.  Follow.  Call with update.    Encounter for screening mammogram for malignant neoplasm of breast Assessment & Plan: States she is up to date.  Just saw Dr Noel Gerold.     Other orders -      Atenolol; Take 1 tablet (50 mg total) by mouth daily.  Dispense:  90 tablet; Refill: 3 -     Triamterene-HCTZ; Take 1 tablet by mouth daily.  Dispense: 90 tablet; Refill: 3     Dale Creola, MD

## 2023-04-05 ENCOUNTER — Encounter: Payer: Self-pay | Admitting: Internal Medicine

## 2023-04-05 DIAGNOSIS — Z1239 Encounter for other screening for malignant neoplasm of breast: Secondary | ICD-10-CM | POA: Insufficient documentation

## 2023-04-05 DIAGNOSIS — M25522 Pain in left elbow: Secondary | ICD-10-CM | POA: Insufficient documentation

## 2023-04-05 NOTE — Assessment & Plan Note (Signed)
Doing well s/p surgery.  Follow.

## 2023-04-05 NOTE — Assessment & Plan Note (Signed)
Saw Dr Tedd Sias 03/06/21 - had f/u ultrasound - diffuse enlargement of thyroid  Two thyroid nodules, measuring up to 0.9 cm in right lobe and 1.0 cm in left lobe. Both have a fairly unremarkable appearance and are stable in size in comparison to a prior ultrasound on 02/2020.  No role for routine follow up neck imaging.

## 2023-04-05 NOTE — Assessment & Plan Note (Signed)
Blood pressure doing well on tenormin and triam/hctz.  Follow pressures.  Follow metabolic panel.

## 2023-04-05 NOTE — Assessment & Plan Note (Signed)
Appears to be c/w tendinitis.  Elbow strap.  Follow.  Call with update.

## 2023-04-05 NOTE — Assessment & Plan Note (Signed)
States she is up to date.  Just saw Dr Noel Gerold.

## 2023-04-05 NOTE — Assessment & Plan Note (Signed)
Colonoscopy 08/19/21 - internal hemorrhoids.  F/u colonoscopy in 10 years.

## 2023-04-21 ENCOUNTER — Other Ambulatory Visit: Payer: Self-pay | Admitting: Internal Medicine

## 2023-04-21 ENCOUNTER — Other Ambulatory Visit: Payer: Self-pay

## 2023-04-21 DIAGNOSIS — K649 Unspecified hemorrhoids: Secondary | ICD-10-CM

## 2023-04-22 ENCOUNTER — Other Ambulatory Visit: Payer: Self-pay

## 2023-04-23 ENCOUNTER — Other Ambulatory Visit: Payer: Self-pay

## 2023-04-24 ENCOUNTER — Other Ambulatory Visit: Payer: Self-pay

## 2023-04-24 MED FILL — Hydrocortisone Cream 2.5%: CUTANEOUS | 15 days supply | Qty: 30 | Fill #0 | Status: AC

## 2023-04-24 NOTE — Telephone Encounter (Signed)
Rx ok'd for hydrocortisone cream.

## 2023-06-05 ENCOUNTER — Other Ambulatory Visit: Payer: Self-pay

## 2023-06-08 ENCOUNTER — Other Ambulatory Visit (HOSPITAL_COMMUNITY): Payer: Self-pay

## 2023-06-08 ENCOUNTER — Other Ambulatory Visit: Payer: Self-pay

## 2023-06-25 ENCOUNTER — Telehealth: Payer: Self-pay | Admitting: Nurse Practitioner

## 2023-06-25 NOTE — Telephone Encounter (Signed)
Spoke to patient by phone. She's interested in evaluation and possible intervention for appearance of her surgical scars. I recommended evaluation with plastic surgery and offered referral to [facility]. She will contact them for an appointment.

## 2023-07-28 ENCOUNTER — Ambulatory Visit: Payer: Commercial Managed Care - PPO | Admitting: Dermatology

## 2023-07-28 ENCOUNTER — Other Ambulatory Visit: Payer: Self-pay

## 2023-07-28 DIAGNOSIS — D229 Melanocytic nevi, unspecified: Secondary | ICD-10-CM | POA: Diagnosis not present

## 2023-07-28 DIAGNOSIS — Z1283 Encounter for screening for malignant neoplasm of skin: Secondary | ICD-10-CM

## 2023-07-28 DIAGNOSIS — D2239 Melanocytic nevi of other parts of face: Secondary | ICD-10-CM | POA: Diagnosis not present

## 2023-07-28 DIAGNOSIS — L821 Other seborrheic keratosis: Secondary | ICD-10-CM

## 2023-07-28 DIAGNOSIS — L719 Rosacea, unspecified: Secondary | ICD-10-CM

## 2023-07-28 DIAGNOSIS — L820 Inflamed seborrheic keratosis: Secondary | ICD-10-CM

## 2023-07-28 DIAGNOSIS — L578 Other skin changes due to chronic exposure to nonionizing radiation: Secondary | ICD-10-CM | POA: Diagnosis not present

## 2023-07-28 DIAGNOSIS — L814 Other melanin hyperpigmentation: Secondary | ICD-10-CM | POA: Diagnosis not present

## 2023-07-28 DIAGNOSIS — D1801 Hemangioma of skin and subcutaneous tissue: Secondary | ICD-10-CM

## 2023-07-28 DIAGNOSIS — L918 Other hypertrophic disorders of the skin: Secondary | ICD-10-CM | POA: Diagnosis not present

## 2023-07-28 DIAGNOSIS — D224 Melanocytic nevi of scalp and neck: Secondary | ICD-10-CM

## 2023-07-28 DIAGNOSIS — W908XXA Exposure to other nonionizing radiation, initial encounter: Secondary | ICD-10-CM | POA: Diagnosis not present

## 2023-07-28 DIAGNOSIS — D225 Melanocytic nevi of trunk: Secondary | ICD-10-CM | POA: Diagnosis not present

## 2023-07-28 DIAGNOSIS — L853 Xerosis cutis: Secondary | ICD-10-CM

## 2023-07-28 DIAGNOSIS — L299 Pruritus, unspecified: Secondary | ICD-10-CM

## 2023-07-28 MED ORDER — MOMETASONE FUROATE 0.1 % EX CREA
TOPICAL_CREAM | Freq: Two times a day (BID) | CUTANEOUS | 1 refills | Status: AC
  Filled 2023-07-28: qty 45, 30d supply, fill #0

## 2023-07-28 NOTE — Progress Notes (Signed)
 New Patient Visit   Subjective  Jennifer Olsen is a 48 y.o. female who presents for the following: Skin Cancer Screening and Full Body Skin Exam  The patient presents for Total-Body Skin Exam (TBSE) for skin cancer screening and mole check. The patient has spots, moles and lesions to be evaluated, some may be new or changing. She has an itchy area on her back.  Skin tag she would like removed.   The following portions of the chart were reviewed this encounter and updated as appropriate: medications, allergies, medical history  Review of Systems:  No other skin or systemic complaints except as noted in HPI or Assessment and Plan.  Objective  Well appearing patient in no apparent distress; mood and affect are within normal limits.  A full examination was performed including scalp, head, eyes, ears, nose, lips, neck, chest, axillae, abdomen, back, buttocks, bilateral upper extremities, bilateral lower extremities, hands, feet, fingers, toes, fingernails, and toenails. All findings within normal limits unless otherwise noted below.   Relevant physical exam findings are noted in the Assessment and Plan.    Assessment & Plan   SKIN CANCER SCREENING PERFORMED TODAY.  ACTINIC DAMAGE - Chronic condition, secondary to cumulative UV/sun exposure - diffuse scaly erythematous macules with underlying dyspigmentation - Recommend daily broad spectrum sunscreen SPF 30+ to sun-exposed areas, reapply every 2 hours as needed.  - Staying in the shade or wearing long sleeves, sun glasses (UVA+UVB protection) and wide brim hats (4-inch brim around the entire circumference of the hat) are also recommended for sun protection.  - Call for new or changing lesions.  LENTIGINES, SEBORRHEIC KERATOSES, HEMANGIOMAS - Benign normal skin lesions - Benign-appearing - Call for any changes  MELANOCYTIC NEVI - Tan-brown and/or pink-flesh-colored symmetric macules and papules - 3.0 mm thin pink flesh papule at right  upper back - flesh papule at left temple, right temporal scalp - Benign appearing on exam today - Observation - Call clinic for new or changing moles - Recommend daily use of broad spectrum spf 30+ sunscreen to sun-exposed areas.   Acrochordons (Skin Tags) - Removal desired by patient - Fleshy, skin-colored pedunculated papules - Benign appearing.  - Patient desires removal. Reviewed that this is not covered by insurance and they will be charged a cosmetic fee for removal. Patient signed non-covered consent.  - Prior to the procedure, reviewed the expected small wound. Also reviewed the risk of leaving a small scar and the small risk of infection.  PROCEDURE - The areas were prepped with isopropyl alcohol. A small amount of lidocaine 1% with epinephrine was injected at the base of each lesion to achieve good local anesthesia. The skin tags were removed using a snip technique. Aluminum chloride was used for hemostasis. Petrolatum and a bandage were applied. The procedure was tolerated well. - Wound care was reviewed with the patient. They were advised to call with any concerns. Total number of treated acrochordons x 2, right lateral breast.  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   INFLAMED SEBORRHEIC KERATOSIS VS LICHENOID KERATOSIS VS INFLAMMATORY PAPULE Exam: Left med breast with pink edematous papule; right upper breast with pink edematous papule with slight scale.  Treatment: Start mometasone cream Apply to AA twice daily until improved dsp 45g 1Rf. Avoid applying to face, groin, and axilla. Use as directed. Long-term use can cause thinning of  the skin. Patient to return to clinic if not improving/changing.  POSSIBLE ROSACEA/FLUSHING Exam Mid face erythema   Rosacea is a chronic progressive skin condition usually affecting the  face of adults, causing redness and/or acne bumps. It is treatable but not curable. It sometimes affects the eyes (ocular rosacea) as well. It may respond to topical and/or systemic medication and can flare with stress, sun exposure, alcohol, exercise, topical steroids (including hydrocortisone/cortisone 10) and some foods.  Daily application of broad spectrum spf 30+ sunscreen to face is recommended to reduce flares.  Treatment Plan Mild, no treatment.  Counseling for BBL / IPL / Laser and Coordination of Care Discussed the treatment option of Broad Band Light (BBL) /Intense Pulsed Light (IPL)/ Laser for skin discoloration, including brown spots and redness.  Typically we recommend at least 1-3 treatment sessions about 5-8 weeks apart for best results.  Cannot have tanned skin when BBL performed, and regular use of sunscreen/photoprotection is advised after the procedure to help maintain results. The patient's condition may also require "maintenance treatments" in the future.  The fee for BBL / laser treatments is $350 per treatment session for the whole face.  A fee can be quoted for other parts of the body.  Insurance typically does not pay for BBL/laser treatments and therefore the fee is an out-of-pocket cost. Recommend prophylactic valtrex treatment. Once scheduled for procedure, will send Rx in prior to patient's appointment.     Return in about 2 years (around 07/27/2025) for TBSE.  ICherlyn Labella, CMA, am acting as scribe for Willeen Niece, MD .   Documentation: I have reviewed the above documentation for accuracy and completeness, and I agree with the above.  Willeen Niece, MD

## 2023-07-28 NOTE — Patient Instructions (Addendum)
 Recommend OTC [place] Bond Rapid Relief Anti-Itch cream (pramoxine + menthol), CeraVe Anti-itch cream or lotion (pramoxine), Sarna lotion (Original- menthol + camphor or Sensitive- pramoxine) or Eucerin 12 hour Itch Relief lotion (menthol) up to 3 times per day to areas on body that are itchy.   Recommend  Normand Sloop, M.D. [address], [address] [place], [place] [address] [phone]  [email or web address]  Recommend: Beatrix Fetters, MD [facility]  [address] [place] [address], [address]  [place], [place] [address] [phone] [email or web address]   Due to recent changes in healthcare laws, you may see results of your pathology and/or laboratory studies on MyChart before the doctors have had a chance to review them. We understand that in some cases there may be results that are confusing or concerning to you. Please understand that not all results are received at the same time and often the doctors may need to interpret multiple results in order to provide you with the best plan of care or course of treatment. Therefore, we ask that you please give [place] 2 business days to thoroughly review all your results before contacting the office for clarification. Should we see a critical lab result, you will be contacted sooner.   If You Need Anything After Your Visit  If you have any questions or concerns for your doctor, please call our main line at [phone] and press option 4 to reach your doctor's medical assistant. If no one answers, please leave a voicemail as directed and we will return your call as soon as possible. Messages left after 4 pm will be answered the following business day.   You may also send [place] a message via MyChart. We typically respond to MyChart messages within 1-2 business days.  For prescription refills, please ask your pharmacy to contact our office. Our fax number is [phone].  If you have an urgent issue when the  clinic is closed that cannot wait until the next business day, you can page your doctor at the number below.    Please note that while we do our best to be available for urgent issues outside of office hours, we are not available 24/7.   If you have an urgent issue and are unable to reach [place], you may choose to seek medical care at your doctor's office, retail clinic, urgent care center, or emergency room.  If you have a medical emergency, please immediately call 911 or go to the emergency department.  Pager Numbers  - Dr. Gwen Pounds: [phone]  - Dr. Roseanne Reno: [phone]  - Dr. Katrinka Blazing: [phone]   In the event of inclement weather, please call our main line at [phone] for an update on the status of any delays or closures.  Dermatology Medication Tips: Please keep the boxes that topical medications come in in order to help keep track of the instructions about where and how to use these. Pharmacies typically print the medication instructions only on the boxes and not directly on the medication tubes.   If your medication is too expensive, please contact our office at [phone] option 4 or send [place] a message through MyChart.   We are unable to tell what your co-pay for medications will be in advance as this is different depending on your insurance coverage. However, we may be able to find a substitute medication at lower cost or fill out paperwork to get insurance to cover a needed medication.   If a prior authorization is required to get your  medication covered by your insurance company, please allow [place] 1-2 business days to complete this process.  Drug prices often vary depending on where the prescription is filled and some pharmacies may offer cheaper prices.  The website www.goodrx.com contains coupons for medications through different pharmacies. The prices here do not account for what the cost may be with help from insurance (it may be cheaper with your insurance), but the  website can give you the price if you did not use any insurance.  - You can print the associated coupon and take it with your prescription to the pharmacy.  - You may also stop by our office during regular business hours and pick up a GoodRx coupon card.  - If you need your prescription sent electronically to a different pharmacy, notify our office through [facility] or by phone at [phone] option 4.     Si Usted Necesita Algo Despus de Su Visita  Tambin puede enviarnos un mensaje a travs de Clinical cytogeneticist. Por lo general respondemos a los mensajes de MyChart en el transcurso de 1 a 2 das hbiles.  Para renovar recetas, por favor pida a su farmacia que se ponga en contacto con nuestra oficina. Annie Sable de fax es [place] [phone].  Si tiene un asunto urgente cuando la clnica est cerrada y que no puede esperar hasta el siguiente da hbil, puede llamar/localizar a su doctor(a) al nmero que aparece a continuacin.   Por favor, tenga en cuenta que aunque hacemos todo lo posible para estar disponibles para asuntos urgentes fuera del horario de [place], no estamos disponibles las 24 horas del da, los 7 [address]  [address] de la [place].   Si tiene un problema urgente y no puede comunicarse con nosotros, puede optar por buscar atencin mdica  en el consultorio de su doctor(a), en una clnica privada, en un centro de atencin urgente o en una sala de emergencias.  Si tiene Engineer, drilling, por favor llame inmediatamente al 911 o vaya a la sala de emergencias.  Nmeros de bper  - Dr. Gwen Pounds: [phone]  - Dra. Roseanne Reno: [phone]  - Dr. Katrinka Blazing: [phone]   En caso de inclemencias del tiempo, por favor llame a Lacy Duverney principal al [phone] para una actualizacin sobre el [place] de cualquier retraso o cierre.  Consejos para la medicacin en dermatologa: Por favor, guarde las cajas en las que vienen los medicamentos de uso tpico para ayudarle a seguir las  instrucciones sobre dnde y cmo usarlos. Las farmacias generalmente imprimen las instrucciones del medicamento slo en las cajas y no directamente en los tubos del [place].   Si su medicamento es muy caro, por favor, pngase en contacto con Rolm Gala llamando al [phone] y presione la opcin 4 o envenos un mensaje a travs de Clinical cytogeneticist.   No podemos decirle cul ser su copago por los medicamentos por adelantado ya que esto es diferente dependiendo de la cobertura de su seguro. Sin embargo, es posible que podamos encontrar un medicamento sustituto a Audiological scientist un formulario para que el seguro cubra el medicamento que se considera necesario.   Si se requiere una autorizacin previa para que su compaa de seguros [place] su medicamento, por favor permtanos de 1 a 2 das hbiles para completar [address]  Los precios de los medicamentos varan con frecuencia dependiendo del Environmental consultant de dnde se surte la receta y alguna farmacias pueden ofrecer precios ms baratos.  El sitio web www.goodrx.com tiene cupones para medicamentos de Health and safety inspector. Los  precios aqu no tienen en cuenta lo que podra costar con la ayuda del seguro (puede ser ms barato con su seguro), pero el sitio web puede darle el precio si no utiliz Tourist information centre manager.  - Puede imprimir el cupn correspondiente y llevarlo con su receta a la farmacia.  - Tambin puede pasar por nuestra oficina durante el horario de atencin regular y Education officer, museum una tarjeta de cupones de GoodRx.  - Si necesita que su receta se enve electrnicamente a una farmacia diferente, informe a nuestra oficina a travs de MyChart de [place] o por telfono llamando al [phone] y presione la opcin 4.

## 2023-11-26 MED FILL — Hydrocortisone Cream 2.5%: CUTANEOUS | 15 days supply | Qty: 30 | Fill #1 | Status: AC

## 2023-11-27 ENCOUNTER — Other Ambulatory Visit: Payer: Self-pay

## 2023-11-27 MED ORDER — CIPROFLOXACIN HCL 500 MG PO TABS
500.0000 mg | ORAL_TABLET | Freq: Two times a day (BID) | ORAL | 0 refills | Status: AC
  Filled 2023-11-27: qty 20, 10d supply, fill #0

## 2023-12-02 ENCOUNTER — Other Ambulatory Visit: Payer: Self-pay

## 2023-12-02 MED ORDER — OFLOXACIN 0.3 % OP SOLN
2.0000 [drp] | Freq: Four times a day (QID) | OPHTHALMIC | 0 refills | Status: AC
  Filled 2023-12-02: qty 5, 13d supply, fill #0

## 2023-12-16 ENCOUNTER — Telehealth: Payer: Self-pay

## 2023-12-16 NOTE — Telephone Encounter (Signed)
 Copied from CRM #[id number]. Topic: Appointments - Appointment Scheduling >> Dec 16, 2023  4:45 PM Shereese L wrote: Patient/patient representative is calling to schedule an appointment. Physical  Patient would like an order for labs to be done

## 2023-12-17 ENCOUNTER — Other Ambulatory Visit: Payer: Self-pay

## 2023-12-17 DIAGNOSIS — Z13220 Encounter for screening for lipoid disorders: Secondary | ICD-10-CM

## 2023-12-17 DIAGNOSIS — R7309 Other abnormal glucose: Secondary | ICD-10-CM

## 2023-12-17 DIAGNOSIS — E0789 Other specified disorders of thyroid: Secondary | ICD-10-CM

## 2023-12-17 DIAGNOSIS — I10 Essential (primary) hypertension: Secondary | ICD-10-CM

## 2023-12-18 NOTE — Telephone Encounter (Signed)
 Reviewed labs

## 2024-02-15 ENCOUNTER — Other Ambulatory Visit

## 2024-02-23 ENCOUNTER — Encounter: Admitting: Internal Medicine

## 2024-03-03 ENCOUNTER — Other Ambulatory Visit: Payer: Self-pay

## 2024-04-18 ENCOUNTER — Other Ambulatory Visit: Payer: Self-pay | Admitting: Internal Medicine

## 2025-07-25 ENCOUNTER — Encounter: Admitting: Dermatology
